# Patient Record
Sex: Male | Born: 1960 | Race: White | Hispanic: Refuse to answer | Marital: Married | Smoking: Never smoker
Health system: Southern US, Community
[De-identification: ages and names within clinical notes are randomized; demographics above are authoritative.]

## PROBLEM LIST (undated history)

## (undated) DIAGNOSIS — Z789 Other specified health status: Secondary | ICD-10-CM

## (undated) DIAGNOSIS — M1990 Unspecified osteoarthritis, unspecified site: Secondary | ICD-10-CM

## (undated) HISTORY — PX: WISDOM TOOTH EXTRACTION: SHX21

## (undated) HISTORY — PX: COLONOSCOPY: SHX174

## (undated) HISTORY — PX: APPENDECTOMY: SHX54

## (undated) HISTORY — PX: TONSILLECTOMY: SUR1361

## (undated) HISTORY — PX: ABDOMINAL SURGERY: SHX537

## (undated) HISTORY — PX: KNEE ARTHROSCOPY: SHX127

---

## 2002-03-15 ENCOUNTER — Ambulatory Visit (HOSPITAL_BASED_OUTPATIENT_CLINIC_OR_DEPARTMENT_OTHER): Admission: RE | Admit: 2002-03-15 | Discharge: 2002-03-16 | Payer: Self-pay | Admitting: Orthopedic Surgery

## 2003-05-15 ENCOUNTER — Ambulatory Visit (HOSPITAL_COMMUNITY): Admission: RE | Admit: 2003-05-15 | Discharge: 2003-05-15 | Payer: Self-pay | Admitting: Family Medicine

## 2003-12-24 ENCOUNTER — Ambulatory Visit (HOSPITAL_COMMUNITY): Admission: RE | Admit: 2003-12-24 | Discharge: 2003-12-24 | Payer: Self-pay | Admitting: Family Medicine

## 2008-06-21 HISTORY — PX: KNEE ARTHROSCOPY W/ ACL RECONSTRUCTION: SHX1858

## 2012-01-25 ENCOUNTER — Encounter (HOSPITAL_BASED_OUTPATIENT_CLINIC_OR_DEPARTMENT_OTHER): Payer: Self-pay | Admitting: *Deleted

## 2012-01-25 NOTE — Progress Notes (Signed)
Has had several knee scopes-no labs needed

## 2012-01-26 NOTE — H&P (Signed)
[facility] [address]   [address] [place], [place] [address] [phone] A Division of [facility]  Loreta Ave, M.D.     Robert A. Thurston Hole, M.D.     Lunette Stands, M.D. Eulas Post, M.D.    Buford Dresser, M.D. Estell Harpin, M.D. Genene Churn. Barry Dienes, PA-C            Kirstin A. Shepperson, PA-C [place], OPA-C   RE: [place], [place]   [phone]      DOB: [birth year] PROGRESS NOTE: 12-28-11 History of present illness: Mr. Fulfer is a 51 year old who presents with a left knee injury. He was playing basketball a week ago and landed awkwardly on the left knee felt a rotation and pop after shooting a jump shot. He developed a knee effusion. Since then his knee has felt unstable and "not right". He has used ice and elevation but no medications. He says this does not feel similar to when he tore his ACL in the past.  He initially had medial joint line pain however he does not have significant pain in the knee.  Past medical history reviewed and noted in the chart otherwise normal pertinent positives are left knee ACL repair in 2003 by Dr. Eulah Pont and debridement arthroscopy in 2008 by Dr. Eulah Pont.   EXAMINATION: Vital signs are noted in the chart and are normal. Well developed well nourished in no acute distress. Left knee has no significant effusion, no tenderness over the joint lines normal range of motion. Anterior drawer and Lachman are negative, normal varus and valgus stress. Mildly positive McMurray's medially.   IMPRESSION: PLAN: 51 year old male with probable meniscus injury. It is possible he has an ACL tear as history is concerning for that however his exam does not support this diagnosis at this time however he may be guarding. We'll plan to obtain a left knee MRI with discussion to proceed over the phone. If MRI shows significant meniscus injury will call patient and arrange for arthroscopic repair. If MRI is normal will  discuss over the phone with patient as well.   Loreta Ave, M.D.  Electronically verified by Loreta Ave, M.D. DFM(EC):kh D 12-27-01 T 12-28-01  [facility] [address]   [address] [place], [place] [address] [phone] A Division of [facility]  Loreta Ave, M.D.     Robert A. Thurston Hole, M.D.     Lunette Stands, M.D. Eulas Post, M.D.    Buford Dresser, M.D. Estell Harpin, M.D. Genene Churn. Barry Dienes, PA-C            Kirstin A. Shepperson, PA-C [place], OPA-C   RE: [place], [place]                                [phone]      DOB: [birth year] PHONE NOTE:  01-03-12 I spoke with Kathlene November on the phone today.  MRI shows a large tear medial meniscus accounting for the vast majority of his symptoms.  Symptoms are tolerable.  He wants to proceed with definitive treatment which is going to be in all likelihood arthroscopic partial medial meniscectomy.  I did state that there are some underlying degenerative changes and the impact that it is going to have on recovery.  He and I are both reassured however that his ligaments are intact and this  is not going to require a major reconstruction.    Loreta Ave, M.D.   Electronically verified by Loreta Ave, M.D. DFM:jjh D 01-03-12 T 01-03-12

## 2012-01-27 ENCOUNTER — Encounter (HOSPITAL_BASED_OUTPATIENT_CLINIC_OR_DEPARTMENT_OTHER): Payer: Self-pay

## 2012-01-27 ENCOUNTER — Encounter (HOSPITAL_BASED_OUTPATIENT_CLINIC_OR_DEPARTMENT_OTHER)
Admission: RE | Disposition: A | Discharge status: Home / Self Care | Payer: Self-pay | Source: Ambulatory Visit | Attending: Orthopedic Surgery

## 2012-01-27 ENCOUNTER — Encounter (HOSPITAL_BASED_OUTPATIENT_CLINIC_OR_DEPARTMENT_OTHER): Payer: Self-pay | Admitting: Anesthesiology

## 2012-01-27 ENCOUNTER — Ambulatory Visit (HOSPITAL_BASED_OUTPATIENT_CLINIC_OR_DEPARTMENT_OTHER)
Admission: RE | Admit: 2012-01-27 | Discharge: 2012-01-27 | Disposition: A | Discharge status: Home / Self Care | Payer: BC Managed Care – PPO | Source: Ambulatory Visit | Attending: Orthopedic Surgery | Admitting: Orthopedic Surgery

## 2012-01-27 ENCOUNTER — Ambulatory Visit (HOSPITAL_BASED_OUTPATIENT_CLINIC_OR_DEPARTMENT_OTHER): Payer: BC Managed Care – PPO | Admitting: Anesthesiology

## 2012-01-27 DIAGNOSIS — M2240 Chondromalacia patellae, unspecified knee: Secondary | ICD-10-CM | POA: Insufficient documentation

## 2012-01-27 DIAGNOSIS — Z4789 Encounter for other orthopedic aftercare: Secondary | ICD-10-CM

## 2012-01-27 DIAGNOSIS — Y998 Other external cause status: Secondary | ICD-10-CM | POA: Insufficient documentation

## 2012-01-27 DIAGNOSIS — X500XXA Overexertion from strenuous movement or load, initial encounter: Secondary | ICD-10-CM | POA: Insufficient documentation

## 2012-01-27 DIAGNOSIS — Y9367 Activity, basketball: Secondary | ICD-10-CM | POA: Insufficient documentation

## 2012-01-27 DIAGNOSIS — IMO0002 Reserved for concepts with insufficient information to code with codable children: Secondary | ICD-10-CM | POA: Insufficient documentation

## 2012-01-27 HISTORY — DX: Other specified health status: Z78.9

## 2012-01-27 SURGERY — Surgical Case
Anesthesia: *Unknown

## 2012-01-27 SURGERY — ARTHROSCOPY, KNEE, WITH MEDIAL MENISCECTOMY
Anesthesia: General | Site: Knee | Laterality: Left | Wound class: Clean

## 2012-01-27 MED ORDER — FENTANYL CITRATE 0.05 MG/ML IJ SOLN
INTRAMUSCULAR | Status: DC | PRN
  Administered 2012-01-27: 100 ug via INTRAVENOUS
  Administered 2012-01-27: 50 ug via INTRAVENOUS

## 2012-01-27 MED ORDER — BUPIVACAINE HCL (PF) 0.5 % IJ SOLN
INTRAMUSCULAR | Status: DC | PRN
  Administered 2012-01-27: 20 mL

## 2012-01-27 MED ORDER — LACTATED RINGERS IV SOLN
INTRAVENOUS | Status: DC
  Administered 2012-01-27: 11:00:00 via INTRAVENOUS

## 2012-01-27 MED ORDER — KETOROLAC TROMETHAMINE 30 MG/ML IJ SOLN
INTRAMUSCULAR | Status: DC | PRN
  Administered 2012-01-27: 30 mg via INTRAVENOUS

## 2012-01-27 MED ORDER — OXYCODONE-ACETAMINOPHEN 5-325 MG PO TABS: 1.0000 | ORAL_TABLET | ORAL | Status: AC | PRN

## 2012-01-27 MED ORDER — SODIUM CHLORIDE 0.9 % IR SOLN
Status: DC | PRN
  Administered 2012-01-27: 3000 mL

## 2012-01-27 MED ORDER — PROPOFOL 10 MG/ML IV EMUL
INTRAVENOUS | Status: DC | PRN
  Administered 2012-01-27: 200 mg via INTRAVENOUS

## 2012-01-27 MED ORDER — ONDANSETRON HCL 4 MG/2ML IJ SOLN
INTRAMUSCULAR | Status: DC | PRN
  Administered 2012-01-27: 4 mg via INTRAVENOUS

## 2012-01-27 MED ORDER — DEXAMETHASONE SODIUM PHOSPHATE 4 MG/ML IJ SOLN
INTRAMUSCULAR | Status: DC | PRN
  Administered 2012-01-27: 10 mg via INTRAVENOUS

## 2012-01-27 MED ORDER — CEFAZOLIN SODIUM-DEXTROSE 2-3 GM-% IV SOLR
2.0000 g | INTRAVENOUS | Status: AC
  Administered 2012-01-27: 2 g via INTRAVENOUS

## 2012-01-27 MED ORDER — METHYLPREDNISOLONE ACETATE 80 MG/ML IJ SUSP
INTRAMUSCULAR | Status: DC | PRN
  Administered 2012-01-27: 80 mg via INTRA_ARTICULAR

## 2012-01-27 MED ORDER — MIDAZOLAM HCL 5 MG/5ML IJ SOLN
INTRAMUSCULAR | Status: DC | PRN
  Administered 2012-01-27: 2 mg via INTRAVENOUS

## 2012-01-27 MED ORDER — LIDOCAINE HCL (CARDIAC) 20 MG/ML IV SOLN
INTRAVENOUS | Status: DC | PRN
  Administered 2012-01-27: 60 mg via INTRAVENOUS

## 2012-01-27 MED ORDER — HYDROMORPHONE HCL PF 1 MG/ML IJ SOLN
0.2500 mg | INTRAMUSCULAR | Status: DC | PRN
  Administered 2012-01-27 (×2): 0.5 mg via INTRAVENOUS

## 2012-01-27 SURGICAL SUPPLY — 37 items
BANDAGE ELASTIC 6 VELCRO ST LF (GAUZE/BANDAGES/DRESSINGS) ×4 IMPLANT
BLADE CUDA 5.5 (BLADE) IMPLANT
BLADE CUDA GRT WHITE 3.5 (BLADE) IMPLANT
BLADE CUTTER GATOR 3.5 (BLADE) ×2 IMPLANT
BLADE CUTTER MENIS 5.5 (BLADE) IMPLANT
BLADE GREAT WHITE 4.2 (BLADE) ×2 IMPLANT
BUR OVAL 4.0 (BURR) IMPLANT
CANISTER OMNI JUG 16 LITER (MISCELLANEOUS) ×2 IMPLANT
CANISTER SUCTION 2500CC (MISCELLANEOUS) IMPLANT
CLOTH BEACON ORANGE TIMEOUT ST (SAFETY) ×2 IMPLANT
CUTTER MENISCUS  4.2MM (BLADE)
CUTTER MENISCUS 4.2MM (BLADE) IMPLANT
DRAPE ARTHROSCOPY W/POUCH 90 (DRAPES) ×2 IMPLANT
DURAPREP 26ML APPLICATOR (WOUND CARE) ×2 IMPLANT
ELECT MENISCUS 165MM 90D (ELECTRODE) IMPLANT
ELECT REM PT RETURN 9FT ADLT (ELECTROSURGICAL) ×2
ELECTRODE REM PT RTRN 9FT ADLT (ELECTROSURGICAL) ×1 IMPLANT
GAUZE XEROFORM 1X8 LF (GAUZE/BANDAGES/DRESSINGS) ×2 IMPLANT
GLOVE BIO SURGEON STRL SZ 6.5 (GLOVE) ×2 IMPLANT
GLOVE BIOGEL PI IND STRL 8 (GLOVE) ×1 IMPLANT
GLOVE BIOGEL PI INDICATOR 8 (GLOVE) ×1
GLOVE INDICATOR 7.0 STRL GRN (GLOVE) ×2 IMPLANT
GLOVE ORTHO TXT STRL SZ7.5 (GLOVE) ×4 IMPLANT
GOWN PREVENTION PLUS XLARGE (GOWN DISPOSABLE) ×2 IMPLANT
GOWN STRL REIN 2XL XLG LVL4 (GOWN DISPOSABLE) ×2 IMPLANT
HOLDER KNEE FOAM BLUE (MISCELLANEOUS) ×2 IMPLANT
KNEE WRAP E Z 3 GEL PACK (MISCELLANEOUS) ×2 IMPLANT
PACK ARTHROSCOPY DSU (CUSTOM PROCEDURE TRAY) ×2 IMPLANT
PACK BASIN DAY SURGERY FS (CUSTOM PROCEDURE TRAY) ×2 IMPLANT
PENCIL BUTTON HOLSTER BLD 10FT (ELECTRODE) IMPLANT
SET ARTHROSCOPY TUBING (MISCELLANEOUS) ×2
SET ARTHROSCOPY TUBING LN (MISCELLANEOUS) ×1 IMPLANT
SPONGE GAUZE 4X4 12PLY (GAUZE/BANDAGES/DRESSINGS) ×4 IMPLANT
SUT ETHILON 3 0 PS 1 (SUTURE) ×2 IMPLANT
SUT VIC AB 3-0 FS2 27 (SUTURE) IMPLANT
TOWEL OR 17X24 6PK STRL BLUE (TOWEL DISPOSABLE) ×2 IMPLANT
WATER STERILE IRR 1000ML POUR (IV SOLUTION) ×2 IMPLANT

## 2012-01-27 NOTE — Anesthesia Preprocedure Evaluation (Signed)
Anesthesia Evaluation  Patient identified by MRN, date of birth, ID band Patient awake    Reviewed: Allergy & Precautions, H&P , NPO status , Patient's Chart, lab work & pertinent test results  Airway Mallampati: II TM Distance: >3 FB Neck ROM: Full    Dental No notable dental hx. (+) Teeth Intact and Dental Advisory Given   Pulmonary neg pulmonary ROS,  breath sounds clear to auscultation  Pulmonary exam normal       Cardiovascular negative cardio ROS  Rhythm:Regular Rate:Normal     Neuro/Psych negative neurological ROS  negative psych ROS   GI/Hepatic negative GI ROS, Neg liver ROS,   Endo/Other  negative endocrine ROS  Renal/GU negative Renal ROS  negative genitourinary   Musculoskeletal   Abdominal   Peds  Hematology negative hematology ROS (+)   Anesthesia Other Findings   Reproductive/Obstetrics negative OB ROS                           Anesthesia Physical Anesthesia Plan  ASA: I  Anesthesia Plan: General   Post-op Pain Management:    Induction: Intravenous  Airway Management Planned: LMA  Additional Equipment:   Intra-op Plan:   Post-operative Plan: Extubation in OR  Informed Consent: I have reviewed the patients History and Physical, chart, labs and discussed the procedure including the risks, benefits and alternatives for the proposed anesthesia with the patient or authorized representative who has indicated his/her understanding and acceptance.   Dental advisory given  Plan Discussed with: CRNA  Anesthesia Plan Comments:         Anesthesia Quick Evaluation  

## 2012-01-27 NOTE — Anesthesia Procedure Notes (Signed)
Procedure Name: LMA Insertion Performed by: Johnice Riebe W Pre-anesthesia Checklist: Patient identified, Timeout performed, Emergency Drugs available, Suction available and Patient being monitored Patient Re-evaluated:Patient Re-evaluated prior to inductionOxygen Delivery Method: Circle system utilized Preoxygenation: Pre-oxygenation with 100% oxygen Intubation Type: IV induction Ventilation: Mask ventilation without difficulty LMA: LMA inserted LMA Size: 4.0 Number of attempts: 1 Tube secured with: Tape Dental Injury: Teeth and Oropharynx as per pre-operative assessment      

## 2012-01-27 NOTE — Interval H&P Note (Signed)
History and Physical Interval Note:  01/27/2012 7:35 AM  Glenn Daniels  has presented today for surgery, with the diagnosis of Left knee tear, meniscus medial knee  The various methods of treatment have been discussed with the patient and family. After consideration of risks, benefits and other options for treatment, the patient has consented to  Procedure(s) (LRB): KNEE ARTHROSCOPY WITH MEDIAL MENISECTOMY (Left) as a surgical intervention .  The patient's history has been reviewed, patient examined, no change in status, stable for surgery.  I have reviewed the patient's chart and labs.  Questions were answered to the patient's satisfaction.     MURPHY,DANIEL F

## 2012-01-27 NOTE — Brief Op Note (Signed)
01/27/2012  12:56 PM  PATIENT:  Glenn Daniels  51 y.o. male  PRE-OPERATIVE DIAGNOSIS:  Left knee tear, meniscus medial knee  POST-OPERATIVE DIAGNOSIS:  Left knee tear, meniscus medial knee  PROCEDURE:  Procedure(s) (LRB): KNEE ARTHROSCOPY WITH MEDIAL MENISECTOMY (Left)  SURGEON:  Surgeon(s) and Role:    * Loreta Ave, MD - Primary  PHYSICIAN ASSISTANT: Zonia Kief M     ANESTHESIA:   general  EBL:  Total I/O In: 1100 [I.V.:1100] Out: -   SPECIMEN:  No Specimen  DISPOSITION OF SPECIMEN:  N/A  COUNTS:  YES  TOURNIQUET:  * No tourniquets in log *   PATIENT DISPOSITION:  PACU - hemodynamically stable.

## 2012-01-27 NOTE — Discharge Instructions (Signed)
  Post Anesthesia Home Care Instructions  Activity: Get plenty of rest for the remainder of the day. A responsible adult should stay with you for 24 hours following the procedure.  For the next 24 hours, DO NOT: -Drive a car -Operate machinery -Drink alcoholic beverages -Take any medication unless instructed by your physician -Make any legal decisions or sign important papers.  Meals: Start with liquid foods such as gelatin or soup. Progress to regular foods as tolerated. Avoid greasy, spicy, heavy foods. If nausea and/or vomiting occur, drink only clear liquids until the nausea and/or vomiting subsides. Call your physician if vomiting continues.  Special Instructions/Symptoms: Your throat may feel dry or sore from the anesthesia or the breathing tube placed in your throat during surgery. If this causes discomfort, gargle with warm salt water. The discomfort should disappear within 24 hours.  Discharge Instructions After Orthopedic Procedures:  *You may feel tired and weak following your procedure. It is recommended that you limit physical activity for the next 24 hours and rest at home for the remainder of today and tomorrow. *No strenuous activity should be started without your doctor's permission.  Elevate the extremity that you had surgery on to a level above your heart. This should continue for 48 hours or as instructed by your doctor.  If you had hand, arm or shoulder surgery you should move your fingers frequently unless otherwise instructed by your doctor.  If you had foot, knee or leg surgery you should wiggle your toes frequently unless otherwise instructed by your doctor.  Follow your doctor's exact instructions for activity at home. Use your home equipment as instructed. (Crutches, hard shoes, slings etc.)  Limit your activity as instructed by your doctor.  Report to your doctor should any of the following occur: 1. Extreme swelling of your fingers or toes. 2. Inability  to wiggle your fingers or toes. 3. Coldness, pale or bluish color in your fingers or toes. 4. Loss of sensation, numbness or tingling of your fingers or toes. 5. Unusual smell or odor from under your dressing or cast. 6. Excessive bleeding or drainage from the surgical site. 7. Pain not relieved by medication your doctor has prescribed for you. 8. Cast or dressing too tight (do not get your dressing or cast wet or put anything under          your dressing or cast.)  *Do not change your dressing unless instructed by your doctor or discharge nurse. Then follow exact instructions.  *Follow labeled instructions for any medications that your doctor may have prescribed for you. *Should any questions or complications develop following your procedure, PLEASE CONTACT YOUR DOCTOR.      

## 2012-01-27 NOTE — Transfer of Care (Signed)
Immediate Anesthesia Transfer of Care Note  Patient: Glenn Daniels  Procedure(s) Performed: Procedure(s) (LRB): KNEE ARTHROSCOPY WITH MEDIAL MENISECTOMY (Left)  Patient Location: PACU  Anesthesia Type: General  Level of Consciousness: awake and alert   Airway & Oxygen Therapy: Patient Spontanous Breathing and Patient connected to face mask oxygen  Post-op Assessment: Report given to PACU RN and Post -op Vital signs reviewed and stable  Post vital signs: Reviewed and stable  Complications: No apparent anesthesia complications

## 2012-01-27 NOTE — Anesthesia Postprocedure Evaluation (Signed)
  Anesthesia Post-op Note  Patient: Glenn Daniels  Procedure(s) Performed: Procedure(s) (LRB): KNEE ARTHROSCOPY WITH MEDIAL MENISECTOMY (Left)  Patient Location: PACU  Anesthesia Type: General  Level of Consciousness: awake and alert   Airway and Oxygen Therapy: Patient Spontanous Breathing and Patient connected to face mask oxygen  Post-op Pain: mild  Post-op Assessment: Post-op Vital signs reviewed, Patient's Cardiovascular Status Stable, Respiratory Function Stable, Patent Airway and No signs of Nausea or vomiting  Post-op Vital Signs: Reviewed and stable  Complications: No apparent anesthesia complications

## 2012-01-28 LAB — POCT HEMOGLOBIN-HEMACUE: Hemoglobin: 15.4 g/dL (ref 13.0–17.0)

## 2012-01-28 NOTE — Op Note (Signed)
NAME:  AMINE, ADELSON NO.:  [id number]  MEDICAL RECORD NO.:  [id number]  LOCATION:                                 FACILITY:  PHYSICIAN:  Loreta Ave, M.D.      DATE OF BIRTH:  DATE OF PROCEDURE:  01/27/2012 DATE OF DISCHARGE:                              OPERATIVE REPORT   PREOPERATIVE DIAGNOSIS:  Left knee medial meniscus tear.  POSTOPERATIVE DIAGNOSES:  Left knee medial meniscus tear with a large irreparable radial tear junction in the middle posterior third, medial meniscus.  Also grade 3 chondromalacia throughout the trochlea, grade 2 and 3 on the patella and medial femoral condyle with some chondral debris.  PROCEDURE:  Left knee exam under anesthesia and arthroscopy.  Partial medial meniscectomy.  Chondroplasty, patellofemoral joint, medial femoral condyle.  Removal of chondral loose bodies.  SURGEON:  Loreta Ave, MD  ASSISTANT:  Genene Churn. Barry Dienes, [place]  ANESTHESIA:  General.  BLOOD LOSS:  Minimal.  SPECIMENS:  None.  CULTURES:  None.  COMPLICATION:  None.  DRESSINGS:  Soft compressive.  TOURNIQUET:  Not employed.  PROCEDURE:  The patient was brought to the operating room and placed on the operating table in supine position.  After adequate anesthesia had been obtained, leg holder applied.  Leg was prepped and draped in usual sterile fashion.  Two portals, one each medial and lateral parapatellar. Arthroscope was introduced.  Knee was distended and inspected.  Good patellar tracking, but some grade 2, mild grade 3 changes peak of the patella.  Lateral facet debrided.  Not tethering.  Almost, the entire trochlea had grade 3 changes getting a little worse when inferiorly. Chondral flaps and loose bodies were removed.  Cruciate ligaments were intact.  Lateral meniscus and lateral compartment were normal.  Medial meniscus large.  Radial tear irreparable, junction of the middle posterior third with a lot of intrameniscal tearing in the  remaining posterior third.  Posterior third removed, tapered in smoothly. Salvaging most of the anterior two thirds.  Some grade 2 changes on the condyle debrided.  Entire knee examined. No other findings were appreciated.  Instruments and fluids were removed.  Portals were closed with nylon.  Knee injected with Depo- Medrol and Marcaine.  Sterile compressive dressing applied.  Anesthesia reversed.  Brought to the recovery room.  Tolerated the surgery well. No complications.     Loreta Ave, M.D.     DFM/MEDQ  D:  01/27/2012  T:  01/28/2012  Job:  [id number]

## 2013-11-02 ENCOUNTER — Ambulatory Visit: Payer: Self-pay | Admitting: Unknown Physician Specialty

## 2013-11-06 LAB — PATHOLOGY REPORT

## 2014-01-01 ENCOUNTER — Inpatient Hospital Stay: Payer: Self-pay | Admitting: Surgery

## 2014-01-03 LAB — PLATELET COUNT: Platelet: 191 10*3/uL (ref 150–440)

## 2014-01-04 LAB — PATHOLOGY REPORT

## 2014-03-01 ENCOUNTER — Other Ambulatory Visit: Payer: Self-pay | Admitting: Orthopedic Surgery

## 2014-05-02 ENCOUNTER — Encounter (HOSPITAL_COMMUNITY): Payer: Self-pay

## 2014-05-02 ENCOUNTER — Encounter (HOSPITAL_COMMUNITY)
Admission: RE | Admit: 2014-05-02 | Discharge: 2014-05-02 | Disposition: A | Discharge status: Home / Self Care | Payer: BC Managed Care – PPO | Source: Ambulatory Visit | Attending: Orthopedic Surgery | Admitting: Orthopedic Surgery

## 2014-05-02 ENCOUNTER — Encounter (HOSPITAL_COMMUNITY): Admission: RE | Admit: 2014-05-02 | Payer: BC Managed Care – PPO | Source: Ambulatory Visit

## 2014-05-02 DIAGNOSIS — Z01812 Encounter for preprocedural laboratory examination: Secondary | ICD-10-CM | POA: Insufficient documentation

## 2014-05-02 HISTORY — DX: Unspecified osteoarthritis, unspecified site: M19.90

## 2014-05-02 LAB — CBC WITH DIFFERENTIAL/PLATELET
Basophils Absolute: 0 10*3/uL (ref 0.0–0.1)
Basophils Relative: 1 % (ref 0–1)
EOS PCT: 3 % (ref 0–5)
Eosinophils Absolute: 0.2 10*3/uL (ref 0.0–0.7)
HCT: 45.5 % (ref 39.0–52.0)
Hemoglobin: 16.1 g/dL (ref 13.0–17.0)
LYMPHS PCT: 42 % (ref 12–46)
Lymphs Abs: 2.2 10*3/uL (ref 0.7–4.0)
MCH: 32.5 pg (ref 26.0–34.0)
MCHC: 35.4 g/dL (ref 30.0–36.0)
MCV: 91.7 fL (ref 78.0–100.0)
Monocytes Absolute: 0.3 10*3/uL (ref 0.1–1.0)
Monocytes Relative: 6 % (ref 3–12)
Neutro Abs: 2.5 10*3/uL (ref 1.7–7.7)
Neutrophils Relative %: 48 % (ref 43–77)
PLATELETS: 203 10*3/uL (ref 150–400)
RBC: 4.96 MIL/uL (ref 4.22–5.81)
RDW: 12.5 % (ref 11.5–15.5)
WBC: 5.2 10*3/uL (ref 4.0–10.5)

## 2014-05-02 LAB — COMPREHENSIVE METABOLIC PANEL
ALT: 27 U/L (ref 0–53)
AST: 22 U/L (ref 0–37)
Albumin: 4 g/dL (ref 3.5–5.2)
Alkaline Phosphatase: 76 U/L (ref 39–117)
Anion gap: 11 (ref 5–15)
BUN: 16 mg/dL (ref 6–23)
CO2: 27 mEq/L (ref 19–32)
Calcium: 9.6 mg/dL (ref 8.4–10.5)
Chloride: 100 mEq/L (ref 96–112)
Creatinine, Ser: 1.11 mg/dL (ref 0.50–1.35)
GFR calc Af Amer: 87 mL/min — ABNORMAL LOW (ref >90)
GFR, EST NON AFRICAN AMERICAN: 75 mL/min — AB (ref >90)
Glucose, Bld: 91 mg/dL (ref 70–99)
Potassium: 4.6 mEq/L (ref 3.7–5.3)
Sodium: 138 mEq/L (ref 137–147)
Total Bilirubin: 0.5 mg/dL (ref 0.3–1.2)
Total Protein: 7.6 g/dL (ref 6.0–8.3)

## 2014-05-02 LAB — URINALYSIS, ROUTINE W REFLEX MICROSCOPIC
BILIRUBIN URINE: NEGATIVE
Glucose, UA: NEGATIVE mg/dL
Hgb urine dipstick: NEGATIVE
Ketones, ur: NEGATIVE mg/dL
LEUKOCYTES UA: NEGATIVE
Nitrite: NEGATIVE
Protein, ur: NEGATIVE mg/dL
Specific Gravity, Urine: <1.005 — ABNORMAL LOW (ref 1.005–1.030)
Urobilinogen, UA: 0.2 mg/dL (ref 0.0–1.0)
pH: 6.5 (ref 5.0–8.0)

## 2014-05-02 LAB — PROTIME-INR
INR: 0.94 (ref 0.00–1.49)
Prothrombin Time: 12.6 seconds (ref 11.6–15.2)

## 2014-05-02 LAB — SURGICAL PCR SCREEN
MRSA, PCR: NEGATIVE
STAPHYLOCOCCUS AUREUS: NEGATIVE

## 2014-05-02 LAB — APTT: APTT: 26 s (ref 24–37)

## 2014-05-02 NOTE — Pre-Procedure Instructions (Addendum)
Glenn ClarityMichael H Daniels  05/02/2014   Your procedure is scheduled on:  Monday, November 23.  [facility].  Call this number if you have problems the morning of surgery: [phone]   Remember:   Do not eat food or drink liquids after midnight Sunday, November 22.  Take these medicines the morning of surgery with A SIP OF WATER: - None              Stop taking Advil Monday, November 16   Do not wear jewelry, make-up or nail polish.  Do not wear lotions, powders, or perfumes.   Men may shave face and neck.  Do not bring valuables to the              [facility] is not responsible for any belongings or valuables.               Contacts, dentures or bridgework may not be worn into surgery.  Leave suitcase in the car. After surgery it may be brought to your room.  For patients admitted to the hospital, discharge time is determined by your treatment team.               Patients discharged the day of surgery will not be allowed to drive home.  Name and phone number of your driver: -   Special Instructions: Review  [place] - Preparing For Surgery.   Please read over the following fact sheets that you were given: Pain Booklet, Coughing and Deep Breathing and Surgical Site Infection Prevention and Incentive Spirometry.

## 2014-05-03 LAB — URINE CULTURE
Colony Count: NO GROWTH
Culture: NO GROWTH

## 2014-05-12 MED ORDER — CHLORHEXIDINE GLUCONATE 4 % EX LIQD
60.0000 mL | Freq: Once | CUTANEOUS | Status: DC
  Filled 2014-05-12: qty 60

## 2014-05-12 MED ORDER — TRANEXAMIC ACID 100 MG/ML IV SOLN
1000.0000 mg | INTRAVENOUS | Status: AC
  Administered 2014-05-13: 1000 mg via INTRAVENOUS
  Filled 2014-05-12: qty 10

## 2014-05-12 MED ORDER — BUPIVACAINE LIPOSOME 1.3 % IJ SUSP
20.0000 mL | Freq: Once | INTRAMUSCULAR | Status: DC
  Filled 2014-05-12: qty 20

## 2014-05-12 MED ORDER — SODIUM CHLORIDE 0.9 % IV SOLN: INTRAVENOUS | Status: DC

## 2014-05-12 MED ORDER — CEFAZOLIN SODIUM-DEXTROSE 2-3 GM-% IV SOLR
2.0000 g | INTRAVENOUS | Status: DC
  Filled 2014-05-12: qty 50

## 2014-05-13 ENCOUNTER — Inpatient Hospital Stay (HOSPITAL_COMMUNITY)
Admission: RE | Admit: 2014-05-13 | Discharge: 2014-05-14 | DRG: 470 | Disposition: A | Discharge status: Home / Self Care | Payer: BC Managed Care – PPO | Source: Ambulatory Visit | Attending: Orthopedic Surgery | Admitting: Orthopedic Surgery

## 2014-05-13 ENCOUNTER — Encounter (HOSPITAL_COMMUNITY)
Admission: RE | Disposition: A | Discharge status: Home / Self Care | Payer: Self-pay | Source: Ambulatory Visit | Attending: Orthopedic Surgery

## 2014-05-13 ENCOUNTER — Inpatient Hospital Stay (HOSPITAL_COMMUNITY): Payer: BC Managed Care – PPO | Admitting: Anesthesiology

## 2014-05-13 ENCOUNTER — Encounter (HOSPITAL_COMMUNITY): Payer: Self-pay | Admitting: *Deleted

## 2014-05-13 DIAGNOSIS — M1711 Unilateral primary osteoarthritis, right knee: Secondary | ICD-10-CM | POA: Diagnosis present

## 2014-05-13 DIAGNOSIS — M25561 Pain in right knee: Secondary | ICD-10-CM | POA: Diagnosis present

## 2014-05-13 DIAGNOSIS — Z96659 Presence of unspecified artificial knee joint: Secondary | ICD-10-CM

## 2014-05-13 DIAGNOSIS — D62 Acute posthemorrhagic anemia: Secondary | ICD-10-CM | POA: Diagnosis not present

## 2014-05-13 HISTORY — PX: TOTAL KNEE ARTHROPLASTY: SHX125

## 2014-05-13 LAB — CBC
HCT: 37.4 % — ABNORMAL LOW (ref 39.0–52.0)
Hemoglobin: 12.9 g/dL — ABNORMAL LOW (ref 13.0–17.0)
MCH: 31.1 pg (ref 26.0–34.0)
MCHC: 34.5 g/dL (ref 30.0–36.0)
MCV: 90.1 fL (ref 78.0–100.0)
Platelets: 197 10*3/uL (ref 150–400)
RBC: 4.15 MIL/uL — ABNORMAL LOW (ref 4.22–5.81)
RDW: 12.7 % (ref 11.5–15.5)
WBC: 9.7 10*3/uL (ref 4.0–10.5)

## 2014-05-13 LAB — CREATININE, SERUM
CREATININE: 0.83 mg/dL (ref 0.50–1.35)
GFR calc Af Amer: >90 mL/min (ref >90)
GFR calc non Af Amer: >90 mL/min (ref >90)

## 2014-05-13 SURGERY — ARTHROPLASTY, KNEE, TOTAL
Anesthesia: General | Site: Knee | Laterality: Right

## 2014-05-13 MED ORDER — ONDANSETRON HCL 4 MG PO TABS: 4.0000 mg | ORAL_TABLET | Freq: Four times a day (QID) | ORAL | Status: DC | PRN

## 2014-05-13 MED ORDER — DIPHENHYDRAMINE HCL 12.5 MG/5ML PO ELIX
12.5000 mg | ORAL_SOLUTION | ORAL | Status: DC | PRN
  Administered 2014-05-14: 25 mg via ORAL
  Filled 2014-05-13: qty 10

## 2014-05-13 MED ORDER — METOCLOPRAMIDE HCL 10 MG PO TABS: 5.0000 mg | ORAL_TABLET | Freq: Three times a day (TID) | ORAL | Status: DC | PRN

## 2014-05-13 MED ORDER — HYDROMORPHONE HCL 1 MG/ML IJ SOLN
0.2500 mg | INTRAMUSCULAR | Status: DC | PRN
  Administered 2014-05-13: 0.5 mg via INTRAVENOUS
  Administered 2014-05-13: 0.25 mg via INTRAVENOUS
  Administered 2014-05-13 (×2): 0.5 mg via INTRAVENOUS

## 2014-05-13 MED ORDER — 0.9 % SODIUM CHLORIDE (POUR BTL) OPTIME
TOPICAL | Status: DC | PRN
  Administered 2014-05-13: 1000 mL

## 2014-05-13 MED ORDER — DOCUSATE SODIUM 100 MG PO CAPS
100.0000 mg | ORAL_CAPSULE | Freq: Two times a day (BID) | ORAL | Status: DC
  Administered 2014-05-13 – 2014-05-14 (×2): 100 mg via ORAL
  Filled 2014-05-13 (×3): qty 1

## 2014-05-13 MED ORDER — ONDANSETRON HCL 4 MG/2ML IJ SOLN: 4.0000 mg | Freq: Four times a day (QID) | INTRAMUSCULAR | Status: DC | PRN

## 2014-05-13 MED ORDER — BISACODYL 5 MG PO TBEC: 5.0000 mg | DELAYED_RELEASE_TABLET | Freq: Every day | ORAL | Status: DC | PRN

## 2014-05-13 MED ORDER — ACETAMINOPHEN 325 MG PO TABS: 650.0000 mg | ORAL_TABLET | Freq: Four times a day (QID) | ORAL | Status: DC | PRN

## 2014-05-13 MED ORDER — HYDROMORPHONE HCL 1 MG/ML IJ SOLN
1.0000 mg | INTRAMUSCULAR | Status: DC | PRN
  Administered 2014-05-13 – 2014-05-14 (×3): 1 mg via INTRAVENOUS
  Filled 2014-05-13 (×3): qty 1

## 2014-05-13 MED ORDER — ACETAMINOPHEN 650 MG RE SUPP: 650.0000 mg | Freq: Four times a day (QID) | RECTAL | Status: DC | PRN

## 2014-05-13 MED ORDER — MIDAZOLAM HCL 5 MG/5ML IJ SOLN
INTRAMUSCULAR | Status: DC | PRN
  Administered 2014-05-13: 2 mg via INTRAVENOUS

## 2014-05-13 MED ORDER — LIDOCAINE HCL (CARDIAC) 20 MG/ML IV SOLN
INTRAVENOUS | Status: AC
  Filled 2014-05-13: qty 5

## 2014-05-13 MED ORDER — MEPERIDINE HCL 25 MG/ML IJ SOLN
6.2500 mg | INTRAMUSCULAR | Status: DC | PRN
Start: 2014-05-13 — End: 2014-05-13
  Administered 2014-05-13: 12.5 mg via INTRAVENOUS

## 2014-05-13 MED ORDER — METOCLOPRAMIDE HCL 5 MG/ML IJ SOLN: 5.0000 mg | Freq: Three times a day (TID) | INTRAMUSCULAR | Status: DC | PRN

## 2014-05-13 MED ORDER — BUPIVACAINE-EPINEPHRINE (PF) 0.5% -1:200000 IJ SOLN
INTRAMUSCULAR | Status: AC
  Filled 2014-05-13: qty 30

## 2014-05-13 MED ORDER — DIPHENHYDRAMINE HCL 50 MG/ML IJ SOLN
6.2500 mg | Freq: Four times a day (QID) | INTRAMUSCULAR | Status: DC | PRN
  Administered 2014-05-13: 6.5 mg via INTRAVENOUS

## 2014-05-13 MED ORDER — BUPIVACAINE-EPINEPHRINE 0.5% -1:200000 IJ SOLN
INTRAMUSCULAR | Status: DC | PRN
  Administered 2014-05-13: 30 mL

## 2014-05-13 MED ORDER — ONDANSETRON HCL 4 MG/2ML IJ SOLN: 4.0000 mg | Freq: Once | INTRAMUSCULAR | Status: DC | PRN

## 2014-05-13 MED ORDER — HYDROMORPHONE HCL 1 MG/ML IJ SOLN
0.5000 mg | INTRAMUSCULAR | Status: DC | PRN
  Administered 2014-05-13: 0.5 mg via INTRAVENOUS

## 2014-05-13 MED ORDER — MIDAZOLAM HCL 2 MG/2ML IJ SOLN
1.0000 mg | Freq: Once | INTRAMUSCULAR | Status: AC
  Administered 2014-05-13: 0.5 mg via INTRAVENOUS

## 2014-05-13 MED ORDER — LIDOCAINE HCL (CARDIAC) 20 MG/ML IV SOLN
INTRAVENOUS | Status: DC | PRN
  Administered 2014-05-13: 100 mg via INTRAVENOUS

## 2014-05-13 MED ORDER — OXYCODONE HCL 5 MG/5ML PO SOLN: 5.0000 mg | Freq: Once | ORAL | Status: AC | PRN

## 2014-05-13 MED ORDER — MIDAZOLAM HCL 2 MG/2ML IJ SOLN
INTRAMUSCULAR | Status: AC
  Filled 2014-05-13: qty 2

## 2014-05-13 MED ORDER — FENTANYL CITRATE 0.05 MG/ML IJ SOLN
INTRAMUSCULAR | Status: DC | PRN
  Administered 2014-05-13: 100 ug via INTRAVENOUS
  Administered 2014-05-13 (×4): 50 ug via INTRAVENOUS
  Administered 2014-05-13 (×2): 100 ug via INTRAVENOUS

## 2014-05-13 MED ORDER — LACTATED RINGERS IV SOLN
INTRAVENOUS | Status: DC | PRN
  Administered 2014-05-13 (×2): via INTRAVENOUS

## 2014-05-13 MED ORDER — METHOCARBAMOL 500 MG PO TABS
ORAL_TABLET | ORAL | Status: AC
  Filled 2014-05-13: qty 1

## 2014-05-13 MED ORDER — HYDROMORPHONE HCL 1 MG/ML IJ SOLN
INTRAMUSCULAR | Status: AC
  Filled 2014-05-13: qty 1

## 2014-05-13 MED ORDER — METHOCARBAMOL 1000 MG/10ML IJ SOLN
500.0000 mg | Freq: Four times a day (QID) | INTRAMUSCULAR | Status: DC | PRN
  Filled 2014-05-13: qty 5

## 2014-05-13 MED ORDER — SENNOSIDES-DOCUSATE SODIUM 8.6-50 MG PO TABS
1.0000 | ORAL_TABLET | Freq: Every evening | ORAL | Status: DC | PRN
  Administered 2014-05-13: 1 via ORAL
  Filled 2014-05-13 (×2): qty 1

## 2014-05-13 MED ORDER — OXYCODONE HCL ER 10 MG PO T12A
10.0000 mg | EXTENDED_RELEASE_TABLET | Freq: Two times a day (BID) | ORAL | Status: DC
  Administered 2014-05-13 – 2014-05-14 (×3): 10 mg via ORAL
  Filled 2014-05-13 (×3): qty 1

## 2014-05-13 MED ORDER — PROPOFOL 10 MG/ML IV BOLUS
INTRAVENOUS | Status: AC
  Filled 2014-05-13: qty 20

## 2014-05-13 MED ORDER — OXYCODONE HCL 5 MG PO TABS
5.0000 mg | ORAL_TABLET | ORAL | Status: DC | PRN
  Administered 2014-05-13 – 2014-05-14 (×9): 10 mg via ORAL
  Filled 2014-05-13 (×9): qty 2

## 2014-05-13 MED ORDER — ONDANSETRON HCL 4 MG/2ML IJ SOLN
INTRAMUSCULAR | Status: DC | PRN
  Administered 2014-05-13: 4 mg via INTRAVENOUS

## 2014-05-13 MED ORDER — ALUM & MAG HYDROXIDE-SIMETH 200-200-20 MG/5ML PO SUSP: 30.0000 mL | ORAL | Status: DC | PRN

## 2014-05-13 MED ORDER — CELECOXIB 200 MG PO CAPS
200.0000 mg | ORAL_CAPSULE | Freq: Two times a day (BID) | ORAL | Status: DC
  Administered 2014-05-13 – 2014-05-14 (×2): 200 mg via ORAL
  Filled 2014-05-13 (×4): qty 1

## 2014-05-13 MED ORDER — SODIUM CHLORIDE 0.9 % IV SOLN
INTRAVENOUS | Status: DC
  Administered 2014-05-13: 75 mL/h via INTRAVENOUS

## 2014-05-13 MED ORDER — CEFAZOLIN SODIUM-DEXTROSE 2-3 GM-% IV SOLR
INTRAVENOUS | Status: DC | PRN
  Administered 2014-05-13: 2 g via INTRAVENOUS

## 2014-05-13 MED ORDER — CEFAZOLIN SODIUM-DEXTROSE 2-3 GM-% IV SOLR
2.0000 g | Freq: Four times a day (QID) | INTRAVENOUS | Status: AC
  Administered 2014-05-13 (×2): 2 g via INTRAVENOUS
  Filled 2014-05-13 (×3): qty 50

## 2014-05-13 MED ORDER — BUPIVACAINE-EPINEPHRINE (PF) 0.5% -1:200000 IJ SOLN
INTRAMUSCULAR | Status: DC | PRN
  Administered 2014-05-13: 30 mL via PERINEURAL

## 2014-05-13 MED ORDER — SODIUM CHLORIDE 0.9 % IR SOLN
Status: DC | PRN
  Administered 2014-05-13: 1000 mL

## 2014-05-13 MED ORDER — MEPERIDINE HCL 25 MG/ML IJ SOLN
INTRAMUSCULAR | Status: AC
  Filled 2014-05-13: qty 1

## 2014-05-13 MED ORDER — DIPHENHYDRAMINE HCL 50 MG/ML IJ SOLN
INTRAMUSCULAR | Status: AC
Start: 2014-05-13 — End: 2014-05-13
  Filled 2014-05-13: qty 1

## 2014-05-13 MED ORDER — BUPIVACAINE LIPOSOME 1.3 % IJ SUSP
INTRAMUSCULAR | Status: DC | PRN
  Administered 2014-05-13: 20 mL

## 2014-05-13 MED ORDER — OXYCODONE HCL 5 MG PO TABS
ORAL_TABLET | ORAL | Status: AC
  Filled 2014-05-13: qty 1

## 2014-05-13 MED ORDER — ONDANSETRON HCL 4 MG/2ML IJ SOLN
INTRAMUSCULAR | Status: AC
  Filled 2014-05-13: qty 2

## 2014-05-13 MED ORDER — FENTANYL CITRATE 0.05 MG/ML IJ SOLN
INTRAMUSCULAR | Status: AC
Start: 2014-05-13 — End: 2014-05-13
  Filled 2014-05-13: qty 5

## 2014-05-13 MED ORDER — FLEET ENEMA 7-19 GM/118ML RE ENEM: 1.0000 | ENEMA | Freq: Once | RECTAL | Status: AC | PRN

## 2014-05-13 MED ORDER — OXYCODONE HCL 5 MG PO TABS
5.0000 mg | ORAL_TABLET | Freq: Once | ORAL | Status: AC | PRN
  Administered 2014-05-13: 5 mg via ORAL

## 2014-05-13 MED ORDER — PHENOL 1.4 % MT LIQD: 1.0000 | OROMUCOSAL | Status: DC | PRN

## 2014-05-13 MED ORDER — ZOLPIDEM TARTRATE 5 MG PO TABS: 5.0000 mg | ORAL_TABLET | Freq: Every evening | ORAL | Status: DC | PRN

## 2014-05-13 MED ORDER — FENTANYL CITRATE 0.05 MG/ML IJ SOLN
INTRAMUSCULAR | Status: AC
  Filled 2014-05-13: qty 5

## 2014-05-13 MED ORDER — PROPOFOL 10 MG/ML IV BOLUS
INTRAVENOUS | Status: DC | PRN
  Administered 2014-05-13: 150 mg via INTRAVENOUS

## 2014-05-13 MED ORDER — MENTHOL 3 MG MT LOZG: 1.0000 | LOZENGE | OROMUCOSAL | Status: DC | PRN

## 2014-05-13 MED ORDER — METHOCARBAMOL 500 MG PO TABS
500.0000 mg | ORAL_TABLET | Freq: Four times a day (QID) | ORAL | Status: DC | PRN
  Administered 2014-05-13 – 2014-05-14 (×5): 500 mg via ORAL
  Filled 2014-05-13 (×4): qty 1

## 2014-05-13 MED ORDER — ENOXAPARIN SODIUM 30 MG/0.3ML ~~LOC~~ SOLN
30.0000 mg | Freq: Two times a day (BID) | SUBCUTANEOUS | Status: DC
  Administered 2014-05-14: 30 mg via SUBCUTANEOUS
  Filled 2014-05-13 (×3): qty 0.3

## 2014-05-13 SURGICAL SUPPLY — 62 items
BANDAGE ESMARK 6X9 LF (GAUZE/BANDAGES/DRESSINGS) ×1 IMPLANT
BLADE SAGITTAL 13X1.27X60 (BLADE) ×2 IMPLANT
BLADE SAW SGTL 83.5X18.5 (BLADE) ×2 IMPLANT
BLADE SURG 10 STRL SS (BLADE) ×2 IMPLANT
BNDG CMPR 9X6 STRL LF SNTH (GAUZE/BANDAGES/DRESSINGS) ×1
BNDG ESMARK 6X9 LF (GAUZE/BANDAGES/DRESSINGS) ×2
BOWL SMART MIX CTS (DISPOSABLE) ×2 IMPLANT
CAP POR NKTM CP VIT E LN CER ×2 IMPLANT
CEMENT BONE SIMPLEX SPEEDSET (Cement) ×4 IMPLANT
COVER SURGICAL LIGHT HANDLE (MISCELLANEOUS) ×2 IMPLANT
CUFF TOURNIQUET SINGLE 34IN LL (TOURNIQUET CUFF) ×2 IMPLANT
DRAPE EXTREMITY T 121X128X90 (DRAPE) ×2 IMPLANT
DRAPE IMP U-DRAPE 54X76 (DRAPES) ×2 IMPLANT
DRAPE INCISE IOBAN 66X45 STRL (DRAPES) ×4 IMPLANT
DRAPE PROXIMA HALF (DRAPES) IMPLANT
DRAPE U-SHAPE 47X51 STRL (DRAPES) ×2 IMPLANT
DRSG ADAPTIC 3X8 NADH LF (GAUZE/BANDAGES/DRESSINGS) ×2 IMPLANT
DRSG PAD ABDOMINAL 8X10 ST (GAUZE/BANDAGES/DRESSINGS) ×2 IMPLANT
DURAPREP 26ML APPLICATOR (WOUND CARE) ×2 IMPLANT
ELECT REM PT RETURN 9FT ADLT (ELECTROSURGICAL) ×2
ELECTRODE REM PT RTRN 9FT ADLT (ELECTROSURGICAL) ×1 IMPLANT
EVACUATOR 1/8 PVC DRAIN (DRAIN) ×2 IMPLANT
GAUZE SPONGE 4X4 12PLY STRL (GAUZE/BANDAGES/DRESSINGS) ×2 IMPLANT
GLOVE BIOGEL M 7.0 STRL (GLOVE) IMPLANT
GLOVE BIOGEL PI IND STRL 6.5 (GLOVE) ×1 IMPLANT
GLOVE BIOGEL PI IND STRL 7.5 (GLOVE) IMPLANT
GLOVE BIOGEL PI IND STRL 8 (GLOVE) ×1 IMPLANT
GLOVE BIOGEL PI IND STRL 8.5 (GLOVE) ×2 IMPLANT
GLOVE BIOGEL PI INDICATOR 6.5 (GLOVE) ×1
GLOVE BIOGEL PI INDICATOR 7.5 (GLOVE)
GLOVE BIOGEL PI INDICATOR 8 (GLOVE) ×1
GLOVE BIOGEL PI INDICATOR 8.5 (GLOVE) ×2
GLOVE SURG ORTHO 8.0 STRL STRW (GLOVE) ×8 IMPLANT
GLOVE SURG SS PI 6.5 STRL IVOR (GLOVE) ×2 IMPLANT
GOWN STRL REUS W/ TWL LRG LVL3 (GOWN DISPOSABLE) ×1 IMPLANT
GOWN STRL REUS W/ TWL XL LVL3 (GOWN DISPOSABLE) ×2 IMPLANT
GOWN STRL REUS W/TWL LRG LVL3 (GOWN DISPOSABLE) ×2
GOWN STRL REUS W/TWL XL LVL3 (GOWN DISPOSABLE) ×4
HANDPIECE INTERPULSE COAX TIP (DISPOSABLE) ×2
HOOD PEEL AWAY FACE SHEILD DIS (HOOD) ×6 IMPLANT
KIT BASIN OR (CUSTOM PROCEDURE TRAY) ×2 IMPLANT
KIT ROOM TURNOVER OR (KITS) ×2 IMPLANT
MANIFOLD NEPTUNE II (INSTRUMENTS) ×2 IMPLANT
NEEDLE 22X1 1/2 (OR ONLY) (NEEDLE) ×4 IMPLANT
NEEDLE SPNL 18GX3.5 QUINCKE PK (NEEDLE) ×2 IMPLANT
NS IRRIG 1000ML POUR BTL (IV SOLUTION) ×2 IMPLANT
PACK TOTAL JOINT (CUSTOM PROCEDURE TRAY) ×2 IMPLANT
PACK UNIVERSAL I (CUSTOM PROCEDURE TRAY) ×2 IMPLANT
PAD ARMBOARD 7.5X6 YLW CONV (MISCELLANEOUS) ×2 IMPLANT
PADDING CAST COTTON 6X4 STRL (CAST SUPPLIES) ×2 IMPLANT
SET HNDPC FAN SPRY TIP SCT (DISPOSABLE) ×1 IMPLANT
STAPLER VISISTAT 35W (STAPLE) ×2 IMPLANT
SUCTION FRAZIER TIP 10 FR DISP (SUCTIONS) ×2 IMPLANT
SUT BONE WAX W31G (SUTURE) ×2 IMPLANT
SUT VIC AB 0 CTB1 27 (SUTURE) ×4 IMPLANT
SUT VIC AB 1 CT1 27 (SUTURE) ×6
SUT VIC AB 1 CT1 27XBRD ANBCTR (SUTURE) ×3 IMPLANT
SUT VIC AB 2-0 CT1 27 (SUTURE) ×2
SUT VIC AB 2-0 CT1 TAPERPNT 27 (SUTURE) ×1 IMPLANT
SYR 20CC LL (SYRINGE) ×4 IMPLANT
TOWEL OR 17X24 6PK STRL BLUE (TOWEL DISPOSABLE) ×2 IMPLANT
TOWEL OR 17X26 10 PK STRL BLUE (TOWEL DISPOSABLE) ×2 IMPLANT

## 2014-05-13 NOTE — Anesthesia Preprocedure Evaluation (Signed)

## 2014-05-13 NOTE — Care Management Utilization Note (Signed)
UR review completed. Verlaine Embry, RN BSN Case Manager 

## 2014-05-13 NOTE — Anesthesia Postprocedure Evaluation (Signed)
Anesthesia Post Note  Patient: Glenn ClarityMichael H Daniels  Procedure(s) Performed: Procedure(s) (LRB): RIGHT TOTAL KNEE ARTHROPLASTY (Right)  Anesthesia type: general  Patient location: PACU  Post pain: Pain level controlled  Post assessment: Patient's Cardiovascular Status Stable  Last Vitals:  Filed Vitals:   05/13/14 1130  BP: 118/73  Pulse: 66  Temp: 36.5 C  Resp: 16    Post vital signs: Reviewed and stable  Level of consciousness: sedated  Complications: No apparent anesthesia complications

## 2014-05-13 NOTE — Progress Notes (Signed)
Orthopedic Tech Progress Note Patient Details:  Glenn Daniels [birth year] [id number] On cpm at 7:40 pm 0-70 rn to increase  Patient ID: Glenn Daniels, male   DOB: [birth year], 53 y.o.   MRN: [id number]   Jennye MoccasinHughes, [name] 05/13/2014, 7:40 PM

## 2014-05-13 NOTE — Evaluation (Signed)
Physical Therapy Evaluation Patient Details Name: Glenn ClarityMichael H [place] MRN: [id number] DOB: 05-04-1961 Today's Date: 05/13/2014   History of Present Illness  s/p RTKA  Past Medical History  Diagnosis Date  . No pertinent past medical history   . Arthritis    Past Surgical History  Procedure Laterality Date  . Wisdom tooth extraction    . Tonsillectomy    . Knee arthroscopy w/ acl reconstruction  2010    right  . Knee arthroscopy      right x3  . Abdominal surgery      to remove a polop  . Colonoscopy    . Appendectomy      Pt reports polyp removal as well as part of his colon along with appendix.     Clinical Impression  Pt is s/p TKA resulting in the deficits listed below (see PT Problem List).  Pt will benefit from skilled PT to increase their independence and safety with mobility to allow discharge to the venue listed below.      Follow Up Recommendations Home health PT;Supervision/Assistance - 24 hour    Equipment Recommendations  None recommended by PT    Recommendations for Other Services OT consult     Precautions / Restrictions Precautions Precautions: Fall;Knee Precaution Comments: Pt educated to not allow any pillow or bolster under knee for healing with optimal range of motion.  Restrictions Weight Bearing Restrictions: Yes RLE Weight Bearing: Weight bearing as tolerated      Mobility  Bed Mobility Overal bed mobility: Needs Assistance Bed Mobility: Supine to Sit     Supine to sit: Min guard     General bed mobility comments: Cues for technique  Transfers Overall transfer level: Needs assistance Equipment used: Rolling walker (2 wheeled) Transfers: Sit to/from Stand Sit to Stand: Min assist         General transfer comment: Cues for technique, safety, and hand placement  Ambulation/Gait             General Gait Details: Held amb today due to pt with increased lightheadedness  Stairs            Wheelchair Mobility     Modified Rankin (Stroke Patients Only)       Balance Overall balance assessment: Needs assistance         Standing balance support: Bilateral upper extremity supported Standing balance-Leahy Scale: Poor Standing balance comment: Noted decr R knee extension and stability in stance with heel raised off of floor; cues to self-monitor for activity tolerance; ultimately chose to lay back down as pt's lightheadedness increased and he had incr pallor and sweating; Resolved once laying back down                             Pertinent Vitals/Pain Pain Assessment: 0-10 Pain Score: 6  Pain Location: R knee Pain Descriptors / Indicators: Aching (with ankle propped) Pain Intervention(s): Monitored during session;Patient requesting pain meds-RN notified    Home Living Family/patient expects to be discharged to:: Private residence Living Arrangements: Spouse/significant other;Children Available Help at Discharge: Family;Available 24 hours/day Type of Home: House Home Access: Stairs to enter Entrance Stairs-Rails: None Entrance Stairs-Number of Steps: 2 (1+1) Home Layout: Two level;Able to live on main level with bedroom/bathroom Home Equipment: Dan HumphreysWalker - 2 wheels;Bedside commode      Prior Function Level of Independence: Independent               Hand Dominance  Extremity/Trunk Assessment   Upper Extremity Assessment: Overall WFL for tasks assessed           Lower Extremity Assessment: RLE deficits/detail RLE Deficits / Details: Grossly weak postop; able to activate quad and perform SLR in bed; decr knee stability in stance    Cervical / Trunk Assessment: Normal  Communication   Communication: No difficulties  Cognition Arousal/Alertness: Awake/alert Behavior During Therapy: WFL for tasks assessed/performed Overall Cognitive Status: Within Functional Limits for tasks assessed                      General Comments      Exercises  Total Joint Exercises Quad Sets: AROM;Right;10 reps Heel Slides: AAROM;AROM;Right;5 reps Straight Leg Raises: AROM;Right (3 reps)      Assessment/Plan    PT Assessment Patient needs continued PT services  PT Diagnosis Difficulty walking   PT Problem List Decreased strength;Decreased range of motion;Decreased activity tolerance;Decreased balance;Decreased mobility;Decreased knowledge of use of DME;Pain  PT Treatment Interventions DME instruction;Gait training;Stair training;Functional mobility training;Therapeutic activities;Therapeutic exercise;Balance training;Patient/family education   PT Goals (Current goals can be found in the Care Plan section) Acute Rehab PT Goals Patient Stated Goal: back to golf PT Goal Formulation: With patient Time For Goal Achievement: 05/20/14 Potential to Achieve Goals: Good    Frequency 7X/week   Barriers to discharge        Co-evaluation               End of Session Equipment Utilized During Treatment: Gait belt Activity Tolerance: Patient tolerated treatment well;Other (comment) (though limited by lightheadedness) Patient left: in bed;with call bell/phone within reach;with family/visitor present Nurse Communication: Mobility status;Patient requests pain meds         Time: [id number] PT Time Calculation (min) (ACUTE ONLY): 30 min   Charges:   PT Evaluation $Initial PT Evaluation Tier I: 1 Procedure PT Treatments $Therapeutic Exercise: 8-22 mins $Therapeutic Activity: 8-22 mins   PT G Codes:          Olen Pel,  [place] 05/13/2014, 4:47 PM  [place] , [place]  Acute Rehabilitation Services Pager [phone] Office [phone]

## 2014-05-13 NOTE — Care Management Note (Signed)
CARE MANAGEMENT NOTE 05/13/2014  Patient:  Glenn Daniels,Glenn H   Account Number:  [id number]  Date Initiated:  05/13/2014  Documentation initiated by:  Vance PeperBRADY,Bayleigh Loflin  Subjective/Objective Assessment:   [age] yr old male admitted with right knee DJD. Patient had a right total knee arthroplasty.     Action/Plan:   PT/OT eval.  [facility] changes.  CM will continue to monitor.   Anticipated DC Date:  05/14/2014   Anticipated DC Plan:  HOME W HOME HEALTH SERVICES      DC Planning Services  CM consult      PAC Choice  DURABLE MEDICAL EQUIPMENT  HOME HEALTH   Choice offered to / List presented to:  C-1 Patient   DME arranged  CPM  [facility] ROLL[facility]  3-N-1      DME agency  [employer]     HH arranged  HH-2 PT      [facility]  [facility]   Status of service:  In process, will continue to follow Medicare Important Message given?   (If response is "NO", the following Medicare IM given date fields will be blank) Date Medicare IM given:   Medicare IM given by:   Date Additional Medicare IM given:   Additional Medicare IM given by:    Discharge Disposition:    Per UR Regulation:  Reviewed for med. necessity/level of care/duration of stay

## 2014-05-13 NOTE — Anesthesia Procedure Notes (Addendum)
Anesthesia Regional Block:  Adductor canal block  Pre-Anesthetic Checklist: ,, timeout performed, Correct Patient, Correct Site, Correct Laterality, Correct Procedure, Correct Position, site marked, Risks and benefits discussed,  Surgical consent,  Pre-op evaluation,  At surgeon's request and post-op pain management  Laterality: Right  Prep: chloraprep       Needles:  Injection technique: Single-shot  Needle Type: Echogenic Stimulator Needle     Needle Length: 9cm 9 cm Needle Gauge: 21 and 21 G    Additional Needles:  Procedures: ultrasound guided (picture in chart) Adductor canal block Narrative:  Start time: 05/13/2014 7:10 AM End time: 05/13/2014 7:20 AM Injection made incrementally with aspirations every 5 mL.  Performed by: Personally   Additional Notes: Monitors applied. Patient sedated. Sterile prep and drape,hand hygiene and sterile gloves were used. Relevant anatomy identified.Needle position confirmed.Local anesthetic injected incrementally after negative aspiration. Local anesthetic spread visualized around nerve(s). Vascular puncture avoided. No complications. Image printed for medical record.The patient tolerated the procedure well.    Arta BruceKevin Louisa Favaro MD

## 2014-05-13 NOTE — Transfer of Care (Signed)
Immediate Anesthesia Transfer of Care Note  Patient: Glenn Daniels  Procedure(s) Performed: Procedure(s): RIGHT TOTAL KNEE ARTHROPLASTY (Right)  Patient Location: PACU  Anesthesia Type:General  Level of Consciousness: awake  Airway & Oxygen Therapy: Patient Spontanous Breathing and Patient connected to nasal cannula oxygen  Post-op Assessment: Report given to PACU RN and Post -op Vital signs reviewed and stable  Post vital signs: Reviewed and stable  Complications: No apparent anesthesia complications

## 2014-05-13 NOTE — Progress Notes (Signed)
Orthopedic Tech Progress Note Patient Details:  Glenn Daniels [birth year] [id number]  CPM Right Knee CPM Right Knee: On Right Knee Flexion (Degrees): 90 Right Knee Extension (Degrees): 0 Additional Comments: trapeze bar patient helper   Nikki DomCrawford, Jameila Keeny 05/13/2014, 11:06 AM

## 2014-05-13 NOTE — H&P (Signed)
  Glenn Daniels MRN:  [id number] DOB/SEX:  05-06-1961/male  CHIEF COMPLAINT:  Painful right Knee  HISTORY: Patient is a 53 y.o. male presented with a history of pain in the right knee. Onset of symptoms was gradual starting several years ago with gradually worsening course since that time. Prior procedures on the knee include none. Patient has been treated conservatively with over-the-counter NSAIDs and activity modification. Patient currently rates pain in the knee at 10 out of 10 with activity. There is pain at night.  PAST MEDICAL HISTORY: There are no active problems to display for this patient.  Past Medical History  Diagnosis Date  . No pertinent past medical history   . Arthritis    Past Surgical History  Procedure Laterality Date  . Wisdom tooth extraction    . Tonsillectomy    . Knee arthroscopy w/ acl reconstruction  2010    right  . Knee arthroscopy      right x3  . Abdominal surgery      to remove a polop  . Colonoscopy    . Appendectomy      Pt reports polyp removal as well as part of his colon along with appendix.     MEDICATIONS:   No prescriptions prior to admission    ALLERGIES:  No Known Allergies  REVIEW OF SYSTEMS:  A comprehensive review of systems was negative.   FAMILY HISTORY:  History reviewed. No pertinent family history.  SOCIAL HISTORY:   History  Substance Use Topics  . Smoking status: Never Smoker   . Smokeless tobacco: Never Used  . Alcohol Use: Yes     Comment: occ     EXAMINATION:  Vital signs in last 24 hours: Temp:  [97 F (36.1 C)] 97 F (36.1 C) (11/23 0620) Pulse Rate:  [54] 54 (11/23 0620) Resp:  [18] 18 (11/23 0620) BP: (121)/(70) 121/70 mmHg (11/23 0620) SpO2:  [100 %] 100 % (11/23 0620) Weight:  [93.35 kg (205 lb 12.8 oz)] 93.35 kg (205 lb 12.8 oz) (11/23 0620)  General appearance: alert, cooperative and no distress Lungs: clear to auscultation bilaterally Heart: regular rate and rhythm, S1, S2 normal, no  murmur, click, rub or gallop Abdomen: soft, non-tender; bowel sounds normal; no masses,  no organomegaly Extremities: extremities normal, atraumatic, no cyanosis or edema and Homans sign is negative, no sign of DVT Pulses: 2+ and symmetric Skin: Skin color, texture, turgor normal. No rashes or lesions Neurologic: Alert and oriented X 3, normal strength and tone. Normal symmetric reflexes. Normal coordination and gait  Musculoskeletal:  ROM 0-115, Ligaments intact,  Imaging Review Plain radiographs demonstrate severe degenerative joint disease of the right knee. The overall alignment is significant varus. The bone quality appears to be good for age and reported activity level.  Assessment/Plan: End stage arthritis, right knee   The patient history, physical examination and imaging studies are consistent with advanced degenerative joint disease of the right knee. The patient has failed conservative treatment.  The clearance notes were reviewed.  After discussion with the patient it was felt that Total Knee Replacement was indicated. The procedure,  risks, and benefits of total knee arthroplasty were presented and reviewed. The risks including but not limited to aseptic loosening, infection, blood clots, vascular injury, stiffness, patella tracking problems complications among others were discussed. The patient acknowledged the explanation, agreed to proceed with the plan.  , 05/13/2014, 6:42 AM

## 2014-05-13 NOTE — Progress Notes (Signed)
Orthopedic Tech Progress Note Patient Details:  Glenn Daniels 03-Apr-1961 [id number]  Patient ID: Glenn ClarityMichael H Daniels, male   DOB: 03-Apr-1961, 53 y.o.   MRN: [id number] Viewed order from doctor's order list  Nikki DomCrawford, Destinie Thornsberry 05/13/2014, 11:07 AM

## 2014-05-14 ENCOUNTER — Encounter (HOSPITAL_COMMUNITY): Payer: Self-pay | Admitting: Orthopedic Surgery

## 2014-05-14 LAB — BASIC METABOLIC PANEL
Anion gap: 11 (ref 5–15)
BUN: 9 mg/dL (ref 6–23)
CALCIUM: 8.1 mg/dL — AB (ref 8.4–10.5)
CO2: 25 mEq/L (ref 19–32)
Chloride: 102 mEq/L (ref 96–112)
Creatinine, Ser: 0.95 mg/dL (ref 0.50–1.35)
GFR calc Af Amer: >90 mL/min (ref >90)
GFR calc non Af Amer: >90 mL/min (ref >90)
GLUCOSE: 113 mg/dL — AB (ref 70–99)
Potassium: 3.7 mEq/L (ref 3.7–5.3)
Sodium: 138 mEq/L (ref 137–147)

## 2014-05-14 LAB — CBC
HCT: 33.2 % — ABNORMAL LOW (ref 39.0–52.0)
Hemoglobin: 11.6 g/dL — ABNORMAL LOW (ref 13.0–17.0)
MCH: 32.3 pg (ref 26.0–34.0)
MCHC: 34.9 g/dL (ref 30.0–36.0)
MCV: 92.5 fL (ref 78.0–100.0)
PLATELETS: 165 10*3/uL (ref 150–400)
RBC: 3.59 MIL/uL — ABNORMAL LOW (ref 4.22–5.81)
RDW: 12.7 % (ref 11.5–15.5)
WBC: 7 10*3/uL (ref 4.0–10.5)

## 2014-05-14 MED ORDER — OXYCODONE HCL ER 10 MG PO T12A: 10.0000 mg | EXTENDED_RELEASE_TABLET | Freq: Two times a day (BID) | ORAL | Status: DC

## 2014-05-14 MED ORDER — OXYCODONE HCL ER 10 MG PO T12A: 10.0000 mg | EXTENDED_RELEASE_TABLET | Freq: Two times a day (BID) | ORAL | Status: AC

## 2014-05-14 MED ORDER — ENOXAPARIN SODIUM 40 MG/0.4ML ~~LOC~~ SOLN: 40.0000 mg | SUBCUTANEOUS | Status: AC

## 2014-05-14 MED ORDER — METHOCARBAMOL 500 MG PO TABS: 500.0000 mg | ORAL_TABLET | Freq: Four times a day (QID) | ORAL | Status: DC | PRN

## 2014-05-14 MED ORDER — OXYCODONE HCL 5 MG PO TABS: 5.0000 mg | ORAL_TABLET | ORAL | Status: DC | PRN

## 2014-05-14 MED ORDER — OXYCODONE HCL 5 MG PO TABS: 5.0000 mg | ORAL_TABLET | ORAL | Status: AC | PRN

## 2014-05-14 MED ORDER — METHOCARBAMOL 500 MG PO TABS: 500.0000 mg | ORAL_TABLET | Freq: Four times a day (QID) | ORAL | Status: AC | PRN

## 2014-05-14 MED ORDER — ENOXAPARIN SODIUM 40 MG/0.4ML ~~LOC~~ SOLN: 40.0000 mg | SUBCUTANEOUS | Status: DC

## 2014-05-14 NOTE — Care Management Note (Signed)
CARE MANAGEMENT NOTE 05/14/2014  Patient:  Glenn Daniels,Glenn Daniels   Account Number:  [id number]  Date Initiated:  05/13/2014  Documentation initiated by:  Vance PeperBRADY,Jaryd Drew  Subjective/Objective Assessment:   53 yr old male admitted with right knee DJD. Patient had a right total knee arthroplasty.     Action/Plan:   PT/OT eval.  [facility] changes.  CM will continue to monitor.   Anticipated DC Date:  05/14/2014   Anticipated DC Plan:  HOME W HOME HEALTH SERVICES      DC Planning Services  CM consult      PAC Choice  DURABLE MEDICAL EQUIPMENT  HOME HEALTH   Choice offered to / List presented to:  C-1 Patient   DME arranged  CPM  [facility]  3-N-1      DME agency  [employer]     HH arranged  HH-2 PT      HH [facility]   Status of service:  Completed, signed off Medicare Important Message given?   (If response is "NO", the following Medicare IM given date fields will be blank) Date Medicare IM given:   Medicare IM given by:   Date Additional Medicare IM given:   Additional Medicare IM given by:    Discharge Disposition:  HOME W HOME HEALTH SERVICES  Per UR Regulation:  Reviewed for med. necessity/level of care/duration of stay

## 2014-05-14 NOTE — Progress Notes (Signed)
SPORTS MEDICINE AND JOINT REPLACEMENT  Glenn SpurlingStephen Lucey, MD   Glenn CabalMaurice , PA-C [address], [place], [place]  [id number]                             [phone]   PROGRESS NOTE  Subjective:  negative for Chest Pain  negative for Shortness of Breath  negative for Nausea/Vomiting   negative for Calf Pain  negative for Bowel Movement   Tolerating Diet: yes         Patient reports pain as 3 on 0-10 scale.    Objective: Vital signs in last 24 hours:   Patient Vitals for the past 24 hrs:  BP Temp Temp src Pulse Resp SpO2  05/14/14 1251 114/71 mmHg 98.5 F (36.9 C) - 69 16 93 %  05/14/14 0600 117/60 mmHg 98.9 F (37.2 C) - 72 18 92 %  05/14/14 0400 - - - - 18 96 %  05/14/14 0156 114/62 mmHg 99.4 F (37.4 C) - 72 18 96 %  05/14/14 0000 - - - - 18 100 %  05/13/14 2011 118/73 mmHg 98.8 F (37.1 C) - 73 16 100 %  05/13/14 2000 - - - - 18 100 %  05/13/14 1310 (!) 99/58 mmHg 98.1 F (36.7 C) Oral 68 16 -    @flow {1959:LAST@   Intake/Output from previous day:   11/23 0701 - 11/24 0700 In: 3540 [P.O.:1640; I.V.:1400] Out: 500 [Drains:450]   Intake/Output this shift:   11/24 0701 - 11/24 1900 In: -  Out: 200 [Urine:200]   Intake/Output      11/23 0701 - 11/24 0700 11/24 0701 - 11/25 0700   P.O. 1640    I.V. (mL/kg) 1400 (15)    Other 500    Total Intake(mL/kg) 3540 (37.9)    Urine (mL/kg/hr)  200 (0.3)   Drains 450 (0.2)    Blood 50 (0)    Total Output 500 200   Net +3040 -200           LABORATORY DATA:  Recent Labs  05/13/14 1350 05/14/14 0547  WBC 9.7 7.0  HGB 12.9* 11.6*  HCT 37.4* 33.2*  PLT 197 165    Recent Labs  05/13/14 1350 05/14/14 0547  NA  --  138  K  --  3.7  CL  --  102  CO2  --  25  BUN  --  9  CREATININE 0.83 0.95  GLUCOSE  --  113*  CALCIUM  --  8.1*   Lab Results  Component Value Date   INR 0.94 05/02/2014    Examination:  General appearance: alert, cooperative and no distress Extremities: extremities normal,  atraumatic, no cyanosis or edema and Homans sign is negative, no sign of DVT  Wound Exam: clean, dry, intact   Drainage:  Scant/small amount Serosanguinous exudate  Motor Exam: EHL and FHL Intact  Sensory Exam: Deep Peroneal normal   Assessment:    1 Day Post-Op  Procedure(s) (LRB): RIGHT TOTAL KNEE ARTHROPLASTY (Right)  ADDITIONAL DIAGNOSIS:  Active Problems:   S/P total knee replacement using cement  Acute Blood Loss Anemia   Plan: Physical Therapy as ordered Weight Bearing as Tolerated (WBAT)  DVT Prophylaxis:  Lovenox  DISCHARGE PLAN: Home  DISCHARGE NEEDS: HHPT, CPM, Walker and 3-in-1 comode seat         , 05/14/2014, 1:09 PM

## 2014-05-14 NOTE — Discharge Instructions (Signed)
Diet: As you were doing prior to hospitalization  ° °Activity:  Increase activity slowly as tolerated  °                No lifting or driving for 6 weeks ° °Shower:  May shower without a dressing once there is no drainage from your wound. °                Do NOT wash over the wound. °                °Dressing:  You may change your dressing on Wednesday °                   Then change the dressing daily with sterile 4"x4"s gauze dressing  °                   And TED hose for knees. ° °Weight Bearing:  Weight bearing as tolerated as taught in physical therapy.  Use a                                walker or Crutches as instructed. ° °To prevent constipation: you may use a stool softener such as - °              Colace ( over the counter) 100 mg by mouth twice a day  °              Drink plenty of fluids ( prune juice may be helpful) and high fiber foods °               Miralax ( over the counter) for constipation as needed.   ° °Precautions:  If you experience chest pain or shortness of breath - call 911 immediately               For transfer to the hospital emergency department!! °              If you develop a fever greater that 101 F, purulent drainage from wound,                             increased redness or drainage from wound, or calf pain -- Call the office. ° °Follow- Up Appointment:  Please call for an appointment to be seen on 05/28/14 °                                             [place] office:  [phone] °           [address] , [place] [address] °               ° ° °

## 2014-05-14 NOTE — Op Note (Signed)
TOTAL KNEE REPLACEMENT OPERATIVE NOTE:  05/13/2014  11:05 AM  PATIENT:  Glenn ClarityMichael H Lichty  [age] y.o. male  PRE-OPERATIVE DIAGNOSIS:  osteoarthritis right knee  POST-OPERATIVE DIAGNOSIS:  osteoarthritis right knee  PROCEDURE:  Procedure(s): RIGHT TOTAL KNEE ARTHROPLASTY  SURGEON:  Surgeon(s): Dannielle HuhSteve , MD  PHYSICIAN ASSISTANT: Altamese CabalMaurice Jones, [facility]  ANESTHESIA:   spinal  DRAINS: Hemovac  SPECIMEN: None  COUNTS:  Correct  TOURNIQUET:   Total Tourniquet Time Documented: Thigh (Right) - 68 minutes Total: Thigh (Right) - 68 minutes   DICTATION:  Indication for procedure:    The patient is a 53 y.o. male who has failed conservative treatment for osteoarthritis right knee.  Informed consent was obtained prior to anesthesia. The risks versus benefits of the operation were explain and in a way the patient can, and did, understand.   On the implant demand matching protocol, this patient scored 16.  Therefore, this patient was receive a polyethylene insert with vitamin E which is a high demand implant.  Description of procedure:     The patient was taken to the operating room and placed under anesthesia.  The patient was positioned in the usual fashion taking care that all body parts were adequately padded and/or protected.  I foley catheter was not placed.  A tourniquet was applied and the leg prepped and draped in the usual sterile fashion.  The extremity was exsanguinated with the esmarch and tourniquet inflated to 350 mmHg.  Pre-operative range of motion was 10-120  degrees flexion.  The knee was in 5 degree of mild varus.  A midline incision approximately 6-7 inches long was made with a #10 blade.  A new blade was used to make a parapatellar arthrotomy going 2-3 cm into the quadriceps tendon, over the patella, and alongside the medial aspect of the patellar tendon.  A synovectomy was then performed with the #10 blade and forceps. I then elevated the deep MCL off the medial tibial  metaphysis subperiosteally around to the semimembranosus attachment.    I everted the patella and used calipers to measure patellar thickness.  I used the reamer to ream down to appropriate thickness to recreate the native thickness.  I then removed excess bone with the rongeur and sagittal saw.  I used the appropriately sized template and drilled the three lug holes.  I then put the trial in place and measured the thickness with the calipers to ensure recreation of the native thickness.  The trial was then removed and the patella subluxed and the knee brought into flexion.  A homan retractor was place to retract and protect the patella and lateral structures.  A Z-retractor was place medially to protect the medial structures.  The extra-medullary alignment system was used to make cut the tibial articular surface perpendicular to the anamotic axis of the tibia and in 3 degrees of posterior slope.  The cut surface and alignment jig was removed.  I then used the intramedullary alignment guide to make a 6 valgus cut on the distal femur.  I then marked out the epicondylar axis on the distal femur.  The posterior condylar axis measured 3 degrees.  I then used the anterior referencing sizer and measured the femur to be a size 11.  The 4-In-1 cutting block was screwed into place in external rotation matching the posterior condylar angle, making our cuts perpendicular to the epicondylar axis.  Anterior, posterior and chamfer cuts were made with the sagittal saw.  The cutting block and cut pieces were  removed.  A lamina spreader was placed in 90 degrees of flexion.  The ACL, PCL, menisci, and posterior condylar osteophytes were removed.  A 11 mm spacer blocked was found to offer good flexion and extension gap balance after moderate in degree releasing.   The scoop retractor was then placed and the femoral finishing block was pinned in place.  The small sagittal saw was used as well as the lug drill to finish the  femur.  The block and cut surfaces were removed and the medullary canal hole filled with autograft bone from the cut pieces.  The tibia was delivered forward in deep flexion and external rotation.  A size G tray was selected and pinned into place centered on the medial 1/3 of the tibial tubercle.  The reamer and keel was used to prepare the tibia through the tray.    I then trialed with the size 11 femur, size G tibia, a 11 mm insert and the 38 patella.  I had excellent flexion/extension gap balance, excellent patella tracking.  Flexion was full and beyond 120 degrees; extension was zero.  These components were chosen and the staff opened them to me on the back table while the knee was lavaged copiously and the cement mixed.  The soft tissue was infiltrated with 60cc of exparel 1.3% through a 21 gauge needle.  I cemented in the components and removed all excess cement.  The polyethylene tibial component was snapped into place and the knee placed in extension while cement was hardening.  The capsule was infilltrated with 30cc of .25% Marcaine with epinephrine.  A hemovac was place in the joint exiting superolaterally.  A pain pump was place superomedially superficial to the arthrotomy.  Once the cement was hard, the tourniquet was let down.  Hemostasis was obtained.  The arthrotomy was closed with figure-8 #1 vicryl sutures.  The deep soft tissues were closed with #0 vicryls and the subcuticular layer closed with a running #2-0 vicryl.  The skin was reapproximated and closed with skin staples.  The wound was dressed with xeroform, 4 x4's, 2 ABD sponges, a single layer of webril and a TED stocking.   The patient was then awakened, extubated, and taken to the recovery room in stable condition.  BLOOD LOSS:  300cc DRAINS: 1 hemovac, 1 pain catheter COMPLICATIONS:  None.  PLAN OF CARE: Admit to inpatient   PATIENT DISPOSITION:  PACU - hemodynamically stable.   Delay start of Pharmacological VTE agent  (>24hrs) due to surgical blood loss or risk of bleeding:  not applicable  Please fax a copy of this op note to my office at [phone] (please only include page 1 and 2 of the Case Information op note)

## 2014-05-14 NOTE — Progress Notes (Signed)
Physical Therapy Treatment Patient Details Name: Glenn Daniels Wolfgang MRN: [id number] DOB: Oct 22, 1960 Today's Date: 05/14/2014    History of Present Illness Glenn Daniels. Tardif is a 53 y.o. male s/p R TKA. Pt with hx of Left knee scope.    PT Comments    Continuing progress with functional mobility, increasing amb distance and stair trianing complete; OK for dc home from PT standpoint   Follow Up Recommendations  Home health PT;Supervision/Assistance - 24 hour     Equipment Recommendations  None recommended by PT    Recommendations for Other Services       Precautions / Restrictions Precautions Precautions: Fall;Knee Precaution Comments: Pt educated to not allow any pillow or bolster under knee for healing with optimal range of motion.  Restrictions Weight Bearing Restrictions: Yes RLE Weight Bearing: Weight bearing as tolerated    Mobility  Bed Mobility Overal bed mobility: Modified Independent                Transfers Overall transfer level: Needs assistance Equipment used: Rolling walker (2 wheeled) Transfers: Sit to/from Stand Sit to Stand: Supervision         General transfer comment: Min guard for safety. Good hand placement  Ambulation/Gait Ambulation/Gait assistance: Supervision Ambulation Distance (Feet): 520 Feet Assistive device: Rolling walker (2 wheeled) Gait Pattern/deviations: Step-through pattern     General Gait Details: Cues to activate R quad for incr stance stability; cues for posture as well   Stairs Stairs: Yes Stairs assistance: Min assist Stair Management: No rails;Backwards;With walker;Step to pattern (and 2 sideways with rail) Number of Stairs: 4 (total) General stair comments: Verbal and demo cues for technique; managed quite well   Wheelchair Mobility    Modified Rankin (Stroke Patients Only)       Balance                                    Cognition Arousal/Alertness: Awake/alert Behavior During  Therapy: WFL for tasks assessed/performed Overall Cognitive Status: Within Functional Limits for tasks assessed                      Exercises      General Comments        Pertinent Vitals/Pain Pain Assessment: 0-10 Pain Score: 7  Pain Location: R knee Pain Descriptors / Indicators: Aching Pain Intervention(s): Monitored during session    Home Living                      Prior Function            PT Goals (current goals can now be found in the care plan section) Acute Rehab PT Goals Patient Stated Goal: back to golf PT Goal Formulation: With patient Time For Goal Achievement: 05/20/14 Potential to Achieve Goals: Good Progress towards PT goals: Progressing toward goals    Frequency  7X/week    PT Plan Current plan remains appropriate    Co-evaluation             End of Session Equipment Utilized During Treatment: Gait belt Activity Tolerance: Patient tolerated treatment well Patient left: in bed;in CPM;with call bell/phone within reach;with family/visitor present     Time: [id number] PT Time Calculation (min) (ACUTE ONLY): 29 min  Charges:  $Gait Training: 23-37 mins  G Codes:      Van Clines,  [place] 05/14/2014, 5:03 PM  Van Clines , [place]  Acute Rehabilitation Services Pager [phone] Office [phone]

## 2014-05-14 NOTE — Plan of Care (Signed)
Problem: Phase I Progression Outcomes Goal: CMS/Neurovascular status WDL Outcome: Completed/Met Date Met:  05/14/14 Goal: Pain controlled with appropriate interventions Outcome: Completed/Met Date Met:  05/14/14 Goal: Dangle or out of bed evening of surgery Outcome: Completed/Met Date Met:  05/14/14 Goal: Initial discharge plan identified Outcome: Completed/Met Date Met:  05/14/14 Goal: Hemodynamically stable Outcome: Completed/Met Date Met:  05/14/14  Problem: Phase III Progression Outcomes Goal: Pain controlled on oral analgesia Outcome: Completed/Met Date Met:  05/14/14 Goal: Ambulates Outcome: Completed/Met Date Met:  05/14/14 Goal: Incision clean - minimal/no drainage Outcome: Completed/Met Date Met:  05/14/14 Goal: Discharge plan remains appropriate-arrangements made Outcome: Completed/Met Date Met:  05/14/14 Goal: Anticoagulant follow-up in place Outcome: Not Applicable Date Met:  93/79/02  Problem: Discharge Progression Outcomes Goal: Barriers To Progression Addressed/Resolved Outcome: Completed/Met Date Met:  05/14/14 Goal: CMS/Neurovascular status at or above baseline Outcome: Completed/Met Date Met:  05/14/14 Goal: Anticoagulant follow-up in place Outcome: Not Applicable Date Met:  40/97/35 Goal: Pain controlled with appropriate interventions Outcome: Completed/Met Date Met:  05/14/14 Goal: Hemodynamically stable Outcome: Completed/Met Date Met:  32/99/24 Goal: Complications resolved/controlled Outcome: Completed/Met Date Met:  05/14/14 Goal: Tolerates diet Outcome: Completed/Met Date Met:  05/14/14 Goal: Activity appropriate for discharge plan Outcome: Completed/Met Date Met:  05/14/14 Goal: Ambulates safely using assistive device Outcome: Completed/Met Date Met:  05/14/14 Goal: Follows weight - bearing limitations Outcome: Completed/Met Date Met:  05/14/14 Goal: Discharge plan in place and appropriate Outcome: Completed/Met Date Met:  05/14/14 Goal:  Demonstrates ADLs as appropriate Outcome: Completed/Met Date Met:  05/14/14

## 2014-05-14 NOTE — Progress Notes (Signed)
Physical Therapy Treatment Patient Details Name: Glenn Daniels MRN: [id number] DOB: 1960-08-10 Today's Date: 05/14/2014    History of Present Illness Glenn Daniels is a 53 y.o. male s/p R TKA. Pt with hx of Left knee scope.    PT Comments    Much improved activity tolerance and functional mobility today; knee nice and stable in stance; on track for dc home later today; Reinforce stair training with pt and wife next session  Follow Up Recommendations  Home health PT;Supervision/Assistance - 24 hour     Equipment Recommendations  None recommended by PT    Recommendations for Other Services       Precautions / Restrictions Precautions Precautions: Fall;Knee Precaution Comments: Pt educated to not allow any pillow or bolster under knee for healing with optimal range of motion.  Restrictions Weight Bearing Restrictions: Yes RLE Weight Bearing: Weight bearing as tolerated    Mobility  Bed Mobility Overal bed mobility: Modified Independent Bed Mobility: Supine to Sit              Transfers Overall transfer level: Needs assistance Equipment used: Rolling walker (2 wheeled) Transfers: Sit to/from Stand Sit to Stand: Min guard (without physical contact)         General transfer comment: Min guard for safety. Good hand placement  Ambulation/Gait Ambulation/Gait assistance: Min guard;Supervision Ambulation Distance (Feet): 250 Feet Assistive device: Rolling walker (2 wheeled) Gait Pattern/deviations: Step-through pattern     General Gait Details: Cues to activate R quad for incr stance stability; cues for posture as well   Stairs Stairs: Yes Stairs assistance: Min assist Stair Management: No rails;Backwards;With walker;Step to pattern Number of Stairs: 2 General stair comments: Verbal and demo cues for technique; managed quite well and is interested in working on going up a flight of steps  Wheelchair Mobility    Modified Rankin (Stroke Patients  Only)       Balance                                    Cognition Arousal/Alertness: Awake/alert Behavior During Therapy: WFL for tasks assessed/performed Overall Cognitive Status: Within Functional Limits for tasks assessed                      Exercises Total Joint Exercises Ankle Circles/Pumps: AROM;Both;10 reps Quad Sets: AROM;Right;10 reps Short Arc Quad: AROM;Right;10 reps Heel Slides: AROM;Right;10 reps Straight Leg Raises: AROM;Right;10 reps Goniometric ROM: 1-100 deg approx    General Comments        Pertinent Vitals/Pain Pain Assessment: 0-10 Pain Score: 9  Pain Location: R knee at end range flexion Pain Descriptors / Indicators: Aching Pain Intervention(s): Monitored during session    Home Living Family/patient expects to be discharged to:: Private residence Living Arrangements: Spouse/significant other;Children Available Help at Discharge: Family;Available 24 hours/day Type of Home: House Home Access: Stairs to enter Entrance Stairs-Rails: None Home Layout: Two level;Able to live on main level with bedroom/bathroom Home Equipment: Dan HumphreysWalker - 2 wheels;Bedside commode Additional Comments: has received DME and CPM    Prior Function Level of Independence: Independent          PT Goals (current goals can now be found in the care plan section) Acute Rehab PT Goals Patient Stated Goal: back to golf PT Goal Formulation: With patient Time For Goal Achievement: 05/20/14 Potential to Achieve Goals: Good Progress towards PT goals: Progressing toward goals  Frequency  7X/week    PT Plan Current plan remains appropriate    Co-evaluation             End of Session Equipment Utilized During Treatment: Gait belt Activity Tolerance: Patient tolerated treatment well Patient left: in chair;with call bell/phone within reach;with family/visitor present     Time: 0826-0911 PT Time Calculation (min) (ACUTE ONLY): 45  min  Charges:  $Gait Training: 23-37 mins $Therapeutic Exercise: 8-22 mins                    G Codes:      Van Clines,  [place]ff 05/14/2014, 12:04 PM  Van Clines , PT  Acute Rehabilitation Services Pager [phone] Office [phone]

## 2014-05-14 NOTE — Progress Notes (Signed)
Occupational Therapy Evaluation Patient Details Name: Glenn Daniels MRN: [id number] DOB: 1961-04-21 Today's Date: 05/14/2014    History of Present Illness Glenn Daniels is a 53 y.o. male s/p R TKA. Pt with hx of Left knee scope.   Clinical Impression   PTA pt lived at home and was independent with ADLs. He enjoys golfing. Pt currently requires min (A) for LB ADLs and min guard for functional mobility. Pt plans to d/c home today and all education and training completed for safety with ADLs. No further acute OT needs.     Follow Up Recommendations  No OT follow up;Supervision - Intermittent    Equipment Recommendations  None recommended by OT    Recommendations for Other Services       Precautions / Restrictions Precautions Precautions: Fall;Knee Precaution Comments: Reviewed precautions and use of CPM/footsie roll with pt/wife.  Restrictions Weight Bearing Restrictions: Yes RLE Weight Bearing: Weight bearing as tolerated      Mobility Bed Mobility Overal bed mobility: Modified Independent                Transfers Overall transfer level: Needs assistance Equipment used: Rolling walker (2 wheeled) Transfers: Sit to/from Stand Sit to Stand: Min guard         General transfer comment: Min guard for safety. Good hand placement         ADL Overall ADL's : Needs assistance/impaired Eating/Feeding: Independent;Sitting   Grooming: Min guard;Standing   Upper Body Bathing: Set up;Sitting   Lower Body Bathing: Minimal assistance;Sit to/from stand   Upper Body Dressing : Set up;Sitting   Lower Body Dressing: Minimal assistance;Sit to/from stand   Toilet Transfer: Ambulation;RW;Min Psychiatristguard         Tub/Shower Transfer Details (indicate cue type and reason): educated pt/wife on safe shower transfer technique.  Functional mobility during ADLs: Min guard;Rolling walker       Vision  Pt reports no change from baseline.                     Perception Perception Perception Tested?: No   Praxis Praxis Praxis tested?: Within functional limits    Pertinent Vitals/Pain Pain Assessment: 0-10 Pain Score: 4  Pain Location: R knee Pain Descriptors / Indicators: Aching Pain Intervention(s): Monitored during session;Repositioned     Hand Dominance     Extremity/Trunk Assessment Upper Extremity Assessment Upper Extremity Assessment: Overall WFL for tasks assessed   Lower Extremity Assessment Lower Extremity Assessment: Defer to PT evaluation   Cervical / Trunk Assessment Cervical / Trunk Assessment: Normal   Communication Communication Communication: No difficulties   Cognition Arousal/Alertness: Awake/alert Behavior During Therapy: WFL for tasks assessed/performed Overall Cognitive Status: Within Functional Limits for tasks assessed                                Home Living Family/patient expects to be discharged to:: Private residence Living Arrangements: Spouse/significant other;Children Available Help at Discharge: Family;Available 24 hours/day Type of Home: House Home Access: Stairs to enter Entergy CorporationEntrance Stairs-Number of Steps: 2 Entrance Stairs-Rails: None Home Layout: Two level;Able to live on main level with bedroom/bathroom Alternate Level Stairs-Number of Steps: 12 Alternate Level Stairs-Rails: Left Bathroom Shower/Tub: Walk-in shower;Door   Foot LockerBathroom Toilet: Standard     Home Equipment: Environmental consultantWalker - 2 wheels;Bedside commode   Additional Comments: has received DME and CPM      Prior Functioning/Environment Level of Independence: Independent  OT Diagnosis: Generalized weakness;Acute pain    End of Session Equipment Utilized During Treatment: Rolling walker CPM Right Knee CPM Right Knee: Off Additional Comments: trapeze bar patient helper  Activity Tolerance: Patient tolerated treatment well Patient left: in bed;with call bell/phone within reach;with family/visitor  present   Time: 1610-96041023-1041 OT Time Calculation (min): 18 min Charges:  OT General Charges $OT Visit: 1 Procedure OT Evaluation $Initial OT Evaluation Tier I: 1 Procedure OT Treatments $Self Care/Home Management : 8-22 mins  Rae Lips,  M 05/14/2014, 10:48 AM   Carney Living Marie , OTR/L Occupational Therapist [phone] (pager)

## 2014-05-15 NOTE — Discharge Summary (Signed)
SPORTS MEDICINE & JOINT REPLACEMENT   Georgena SpurlingStephen Lucey, MD   Altamese CabalMaurice , PA-C [address], [place], [place]  [address]                             [phone]  PATIENT ID: Glenn Daniels        MRN:  [id number]          DOB/AGE: December 30, 1960 / 53 y.o.    DISCHARGE SUMMARY  ADMISSION DATE:    05/13/2014 DISCHARGE DATE:  05/14/2014  ADMISSION DIAGNOSIS: osteoarthritis right knee    DISCHARGE DIAGNOSIS:  osteoarthritis right knee    ADDITIONAL DIAGNOSIS: Active Problems:   S/P total knee replacement using cement  Past Medical History  Diagnosis Date  . No pertinent past medical history   . Arthritis     PROCEDURE: Procedure(s): RIGHT TOTAL KNEE ARTHROPLASTY on 05/13/2014  CONSULTS:     HISTORY:  See H&P in chart  HOSPITAL COURSE:  Glenn Daniels is a 53 y.o. admitted on 05/13/2014 and found to have a diagnosis of osteoarthritis right knee.  After appropriate laboratory studies were obtained  they were taken to the operating room on 05/13/2014 and underwent Procedure(s): RIGHT TOTAL KNEE ARTHROPLASTY.   They were given perioperative antibiotics:  Anti-infectives    Start     Dose/Rate Route Frequency Ordered Stop   05/13/14 1200  ceFAZolin (ANCEF) IVPB 2 g/50 mL premix     2 g100 mL/hr over 30 Minutes Intravenous Every 6 hours 05/13/14 1139 05/13/14 2230   05/12/14 1118  ceFAZolin (ANCEF) IVPB 2 g/50 mL premix  Status:  Discontinued     2 g100 mL/hr over 30 Minutes Intravenous On call to O.R. 05/12/14 1118 05/13/14 1139    .  Tolerated the procedure well.  Placed with a foley intraoperatively.  Given Ofirmev at induction and for 48 hours.    POD# 1: Vital signs were stable.  Patient denied Chest pain, shortness of breath, or calf pain.  Patient was started on Lovenox 30 mg subcutaneously twice daily at 8am.  Consults to PT, OT, and care management were made.  The patient was weight bearing as tolerated.  CPM was placed on the operative leg 0-90 degrees for 6-8  hours a day.  Incentive spirometry was taught.  Dressing was changed.  Hemovac was discontinued.      POD #2, Continued  PT for ambulation and exercise program.  IV saline locked.  O2 discontinued.    The remainder of the hospital course was dedicated to ambulation and strengthening.   The patient was discharged on 1 day post op in  Good condition.  Blood products given:none  DIAGNOSTIC STUDIES: Recent vital signs: No data found.      Recent laboratory studies:  Recent Labs  05/13/14 1350 05/14/14 0547  WBC 9.7 7.0  HGB 12.9* 11.6*  HCT 37.4* 33.2*  PLT 197 165    Recent Labs  05/13/14 1350 05/14/14 0547  NA  --  138  K  --  3.7  CL  --  102  CO2  --  25  BUN  --  9  CREATININE 0.83 0.95  GLUCOSE  --  113*  CALCIUM  --  8.1*   Lab Results  Component Value Date   INR 0.94 05/02/2014     Recent Radiographic Studies :  No results found.  DISCHARGE INSTRUCTIONS: Discharge Instructions    CPM  Complete by:  As directed   Continuous passive motion machine (CPM):      Use the CPM from 0 to 90 for 6-8 hours per day.      You may increase by 10 per day.  You may break it up into 2 or 3 sessions per day.      Use CPM for 2 weeks or until you are told to stop.     Call MD / Call 911    Complete by:  As directed   If you experience chest pain or shortness of breath, CALL 911 and be transported to the hospital emergency room.  If you develope a fever above 101 F, pus (white drainage) or increased drainage or redness at the wound, or calf pain, call your surgeon's office.     Change dressing    Complete by:  As directed   Change dressing on wednesday, then change the dressing daily with sterile 4 x 4 inch gauze dressing and apply TED hose.     Constipation Prevention    Complete by:  As directed   Drink plenty of fluids.  Prune juice may be helpful.  You may use a stool softener, such as Colace (over the counter) 100 mg twice a day.  Use MiraLax (over the counter)  for constipation as needed.     Diet - low sodium heart healthy    Complete by:  As directed      Do not put a pillow under the knee. Place it under the heel.    Complete by:  As directed      Driving restrictions    Complete by:  As directed   No driving for 6 weeks     Increase activity slowly as tolerated    Complete by:  As directed      Lifting restrictions    Complete by:  As directed   No lifting for 6 weeks     TED hose    Complete by:  As directed   Use stockings (TED hose) for 2 weeks on both leg(s).  You may remove them at night for sleeping.           DISCHARGE MEDICATIONS:     Medication List    TAKE these medications        enoxaparin 40 MG/0.4ML injection  Commonly known as:  LOVENOX  Inject 0.4 mLs (40 mg total) into the skin daily.     methocarbamol 500 MG tablet  Commonly known as:  ROBAXIN  Take 1-2 tablets (500-1,000 mg total) by mouth every 6 (six) hours as needed for muscle spasms.     oxyCODONE 5 MG immediate release tablet  Commonly known as:  Oxy IR/ROXICODONE  Take 1-2 tablets (5-10 mg total) by mouth every 3 (three) hours as needed for breakthrough pain.     OxyCODONE 10 mg T12a 12 hr tablet  Commonly known as:  OXYCONTIN  Take 1 tablet (10 mg total) by mouth every 12 (twelve) hours.        FOLLOW UP VISIT:       Follow-up Information    Follow up with Raymon MuttonLUCEY,STEPHEN D, MD. Call on 05/28/2014.   Specialty:  Orthopedic Surgery   Contact information:   [address] [place] [address] [phone]       [facility]   Why:  [facility] will contact you concerning start date and time for physical therapy.  Contact information:   [address] [place] [address] [phone]       DISPOSITION: HOME  CONDITION:  Good   , 05/15/2014, 5:34 PM

## 2014-10-12 NOTE — Op Note (Signed)
PATIENT NAME:  Glenn Daniels, [name] MR#:  [id number] DATE OF BIRTH:  [birth year]  DATE OF PROCEDURE:  01/01/2014  PREOPERATIVE DIAGNOSIS: Villous adenoma of the ascending colon.   POSTOPERATIVE DIAGNOSIS: Villous adenoma of the ascending colon.   PROCEDURE: Laparoscopic right colectomy.   SURGEON: Renda RollsWilton , M.D.   ANESTHESIA: General.   INDICATIONS: This [age] year old male recently had screening colonoscopy with findings of a 4 cm villous adenoma of the ascending colon. Surgery was recommended for definitive treatment.   DESCRIPTION OF PROCEDURE: The patient was placed on the operating table into the supine position under general endotracheal anesthesia. The abdomen was prepared with ChloraPrep and draped in a sterile manner. An infraumbilical incision was made large enough for a 10 mm port, carried down through subcutaneous tissues. The deep fascia was grasped with a Coker clamp and subsequently with bow and hook and elevated. A Veress needle was inserted into the peritoneal cavity, aspirated and irrigated with a saline solution. Next, the peritoneal cavity was insufflated with carbon dioxide. The Veress needle was removed. The 10 mm cannula was inserted. The 10 mm, 0 degrees laparoscope was inserted to view the peritoneal cavity. The liver appeared normal. Transverse colon was noted.  Location of the cecum determined. Another incision was made in the epigastrium to insert an 11 mm cannula.  A Babcock clamp was introduced to grasp the cecum and elevated for further inspection. The appendix was identified. Terminal ileum identified. Another short incision was made in the right lower quadrant to introduce a 5 mm cannula. Another 11 mm cannula was inserted in the epigastric midline. The right colon was mobilized with incision of the lateral peritoneal reflection using the Harmonic scalpel. Also, the appendix and terminal ileum were mobilized. Next, I noted there was minimal amount of omentum. This was  separated from the right transverse colon and mobilized the hepatic flexure as it was separated from the liver with the Harmonic scalpel. The right colon was further mobilized. The duodenum was identified, and after satisfactory mobilization of the whole right colon, the laparoscopic instruments were removed, seeing no bleeding from the cannula sites.  An upper abdominal midline incision was made from one port site to the other and carried down through subcutaneous tissues through the linea alba and made an opening, which was subsequently enlarged proximally and distally, large enough to bring the colon out on the abdominal wall. I could palpate some soft fullness within the ascending colon. A number of lymph nodes were identified which appeared to be normal size. The middle colic artery was palpated.  A site for a distal margin of resection was selected just to the right of the middle colic vessels. This dissection was begun by making an opening in the mesentery and then dissecting with the Harmonic scalpel. One branch of the middle colic artery was ligated with 0 chromic and divided with the Harmonic scalpel. Another window was made in the small bowel mesentery, some 4 inches proximal to the ileocecal valve, and began that dissection with the Harmonic scalpel. It is noted that the ileocolic artery was ligated with 0 chromic and then divided with the Harmonic scalpel. The ileocolic vein was likewise ligated and divided. The mesenteric dissection was completed with the Harmonic scalpel. The small bowel was brought adjacent to the transverse colon at the intended site of anastomosis holding these segments of bowel elevated with Allis clamps. An enterotomy was made with the cautery and a colotomy was made. The 75 mm GIA stapler was introduced  to begin the anastomosis along the antimesenteric side of the colon and small bowel. The stapler was engaged and activated and the staple line appeared to be hemostatic. Next,  the anastomosis was completed with application of the TA-60 stapler, which was placed perpendicular to the first staple line, engaged, and activated, and the specimen was excised with a scalpel and passed off to a side table. The stapling instrument was removed. The anastomosis was inspected. There was a bleeding point at the colonic end of the staple line and also a small hematoma formed.  This hematoma was amputated with another application of the TA-60 stapler and held this for 30 seconds prior to removing the stapler, and held pressure.  One other bleeding point was ligated with 3-0 chromic suture ligature. Hemostasis was subsequently intact as the anastomosis was inspected and found to be widely patent. The junction of the staple lines was imbricated with a 5-0 Vicryl figure-of-8 suture. Next, the mesenteric defect was closed with a running 3-0 chromic suture. Hemostasis was intact.   At this time, the gown, gloves, instruments, and towels were exchanged for clean ones, and also that the time, the bowel was opened and identified the villous adenoma in the ascending colon which appeared to be some 6 cm in dimension. There was no ulceration and the bowel was placed in formalin for routine pathology. After changing to new instruments, the wound was further inspected. Hemostasis appeared to be intact. Pull suction was placed in the right colic gutter and there was no significant amount of blood found.   Next, the midline fascia was closed with interrupted 0 Maxon figure-of-8 sutures. The skin was closed with interrupted 4-0 nylon vertical mattress sutures. Dressings were applied with paper tape. The patient tolerated surgery satisfactorily and was prepared for transfer to the recovery room.     ____________________________ Shela Commons. Renda Rolls, MD jws:ts D: 01/01/2014 13:37:18 ET T: 01/01/2014 16:12:40 ET JOB#: [id number]  cc: Adella Hare, MD, <Dictator> Adella Hare MD ELECTRONICALLY SIGNED 01/03/2014  9:24

## 2014-10-12 NOTE — Discharge Summary (Signed)
PATIENT NAME:  Edythe ClarityKING, [place] H MR#:  [id number] DATE OF BIRTH:  1960-12-20  DATE OF ADMISSION:  01/01/2014 DATE OF DISCHARGE:  01/04/2014  HISTORY OF PRESENT ILLNESS: This 54 year old male recently had screening colonoscopy with findings of a villous adenoma of the ascending colon area.   PAST MEDICAL HISTORY: Does include headaches and hyperlipidemia.   Details are recorded on the typed H and P.   HOSPITAL COURSE: The patient did have bowel preparation at home and came in through the outpatient surgery department. In the operating room, he had laparoscopic right colectomy. Postoperatively, he was treated with prophylactic subcutaneous heparin. It was further noted that he did have a preop prophylactic antibiotic. He did progress satisfactorily, beginning a liquid diet, and gradually advancing. Did demonstrate bowel function with passing gas and small amount of stool. His wound progressed satisfactorily.   Pathology reviewed.   FINAL DIAGNOSIS: Villous adenoma of the ascending colon.   OPERATION: Laparoscopic right colectomy.   Wound care instructions given and plans made for follow-up in the surgery department.   ____________________________ J. Renda RollsWilton Smith, MD jws:ts D: 01/11/2014 14:53:31 ET T: 01/11/2014 18:36:29 ET JOB#: [id number]  cc: Adella HareJ. Wilton Smith, MD, <Dictator> Adella HareWILTON J SMITH MD ELECTRONICALLY SIGNED 01/16/2014 17:43

## 2016-12-01 ENCOUNTER — Other Ambulatory Visit: Payer: Self-pay | Admitting: Unknown Physician Specialty

## 2016-12-01 DIAGNOSIS — H7490 Unspecified disorder of middle ear and mastoid, unspecified ear: Secondary | ICD-10-CM

## 2016-12-08 ENCOUNTER — Ambulatory Visit
Admission: RE | Admit: 2016-12-08 | Discharge: 2016-12-08 | Disposition: A | Discharge status: Home / Self Care | Payer: 59 | Source: Ambulatory Visit | Attending: Unknown Physician Specialty | Admitting: Unknown Physician Specialty

## 2016-12-08 DIAGNOSIS — H7490 Unspecified disorder of middle ear and mastoid, unspecified ear: Secondary | ICD-10-CM | POA: Diagnosis present

## 2016-12-08 MED ORDER — IOPAMIDOL (ISOVUE-370) INJECTION 76%
75.0000 mL | Freq: Once | INTRAVENOUS | Status: AC | PRN
  Administered 2016-12-08: 75 mL via INTRAVENOUS

## 2018-10-21 IMAGING — CT CT TEMPORAL BONES W/ CM
2 of 5 series · 11 of 40 positions shown, 13 images · IV contrast (isovue)
Comparison: None

CLINICAL DATA: Right-sided hearing loss. Abnormal ectopic exam.
Disorder of the right middle ear.

EXAM:
CT TEMPORAL BONES WITH CONTRAST
TECHNIQUE: Axial and coronal plane CT imaging of the petrous temporal bones was
performed with thin-collimation image reconstruction after
intravenous contrast administration. Multiplanar CT image
reconstructions were also generated.
CONTRAST:  75 mL Isovue 370

[Series 4: coronal bone. · coronal · 0.11mm/px · 3 of 138 slices shown]
[im 35/138  bone]
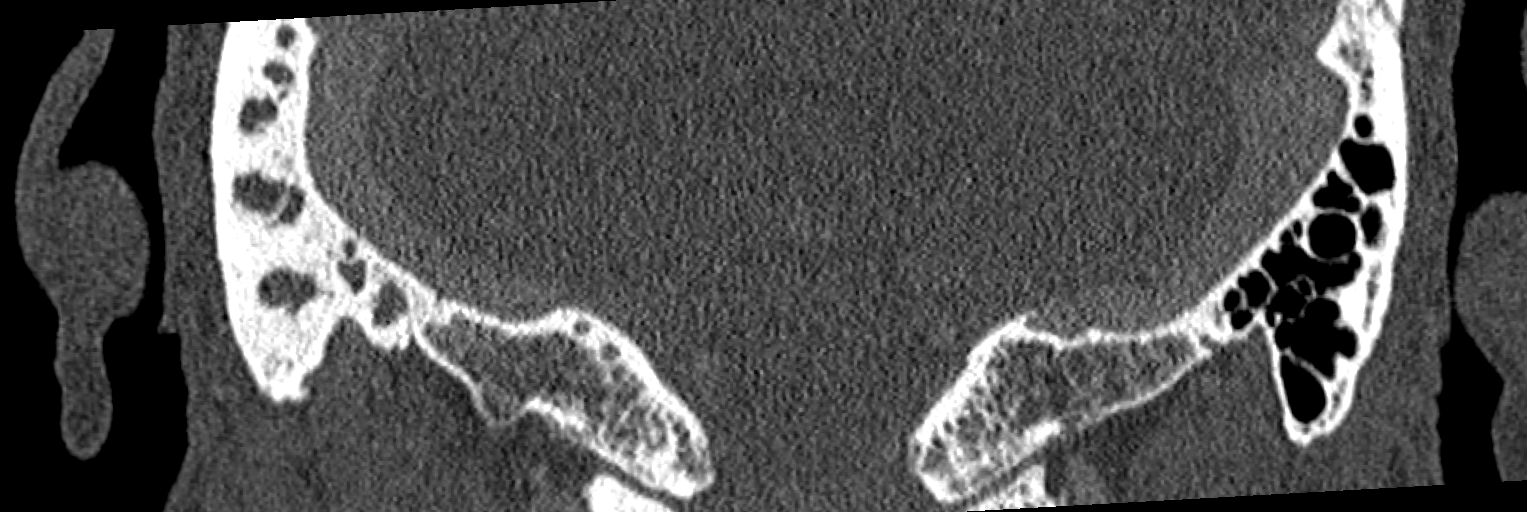
[im 69/138  bone]
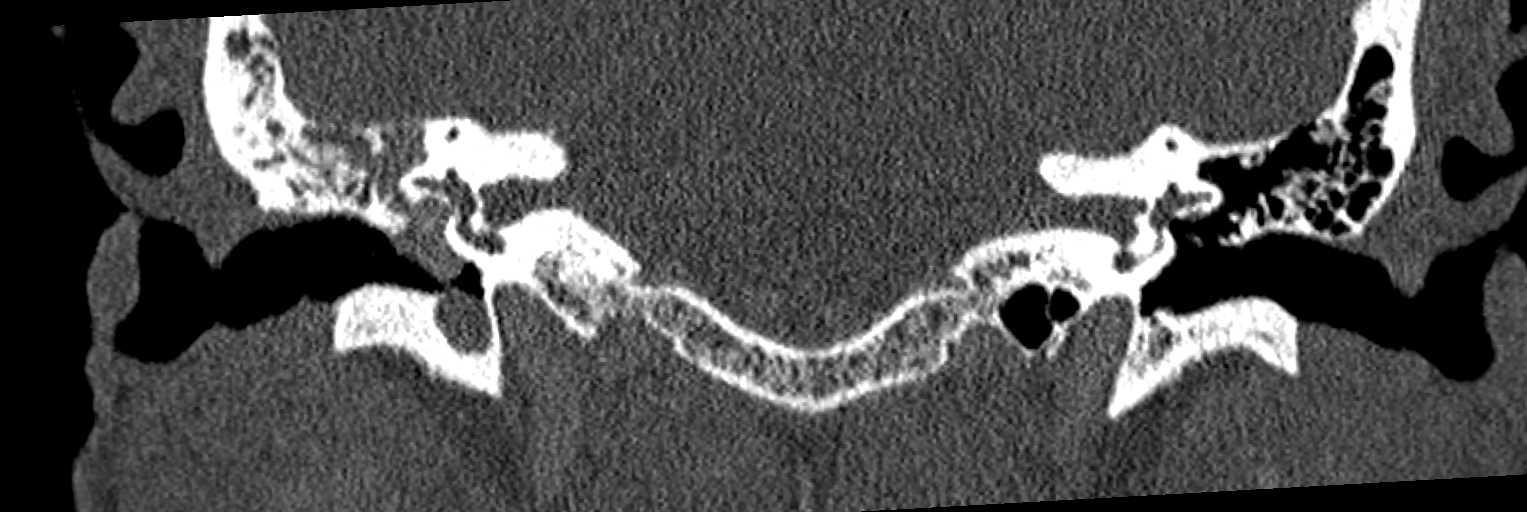
[im 103/138  bone]
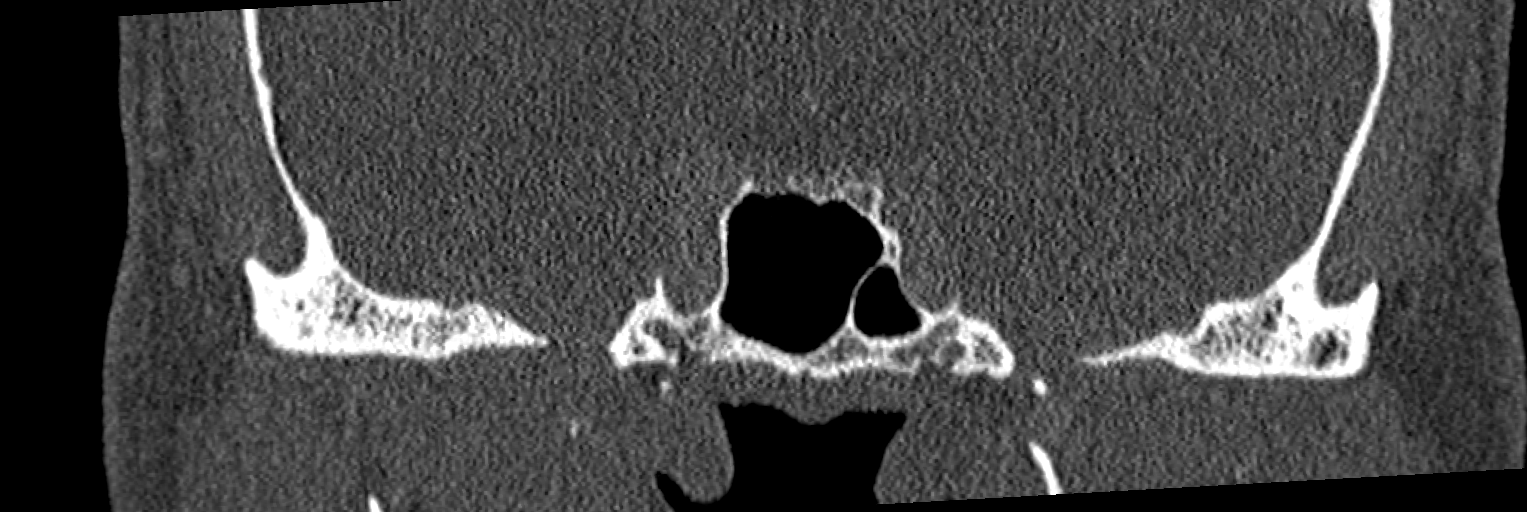

[Series 7: ax mag left · axial · 0.20mm/px · z∈[-80,-41]mm · 8 of 86 slices shown, 10 images]
[im 9/86  brain]
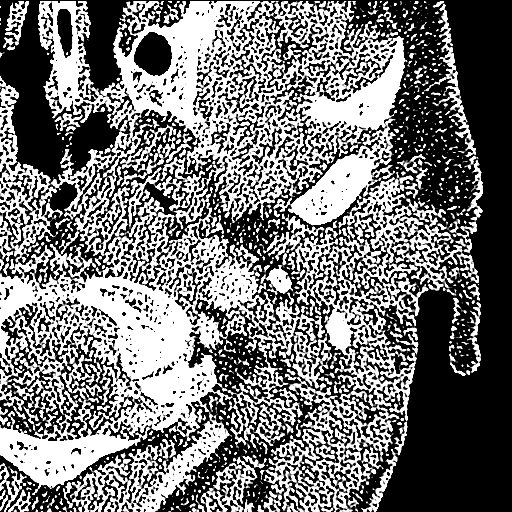
[im 9/86  bone]
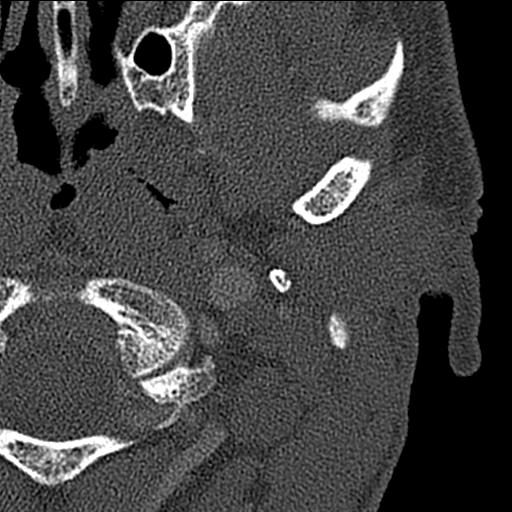
[im 17/86  bone]
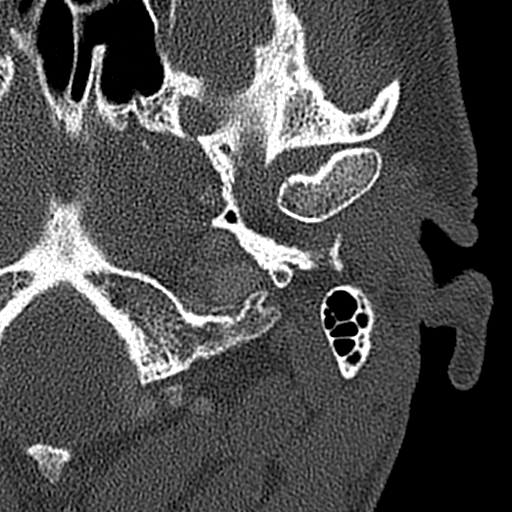
[im 29/86  bone]
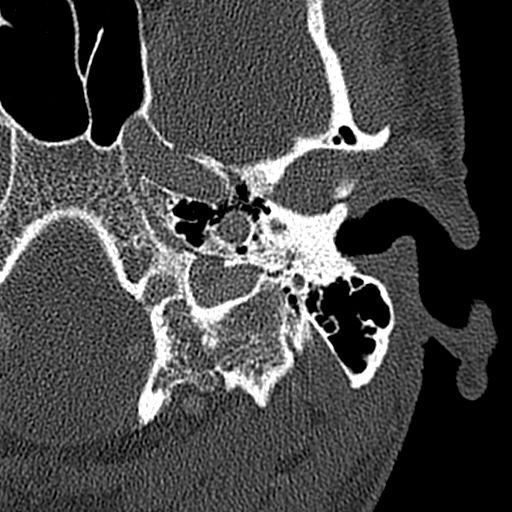
[im 37/86  bone]
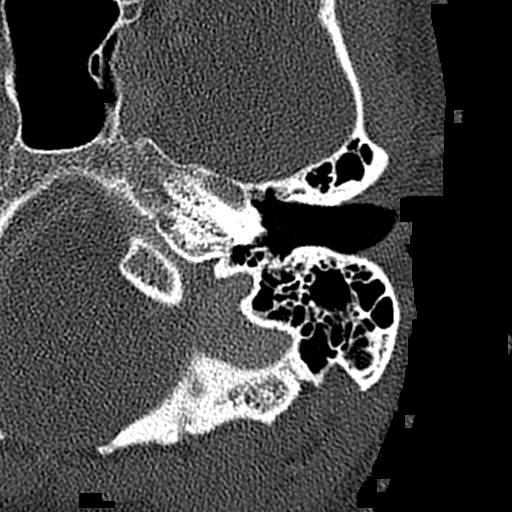
[im 49/86  brain]
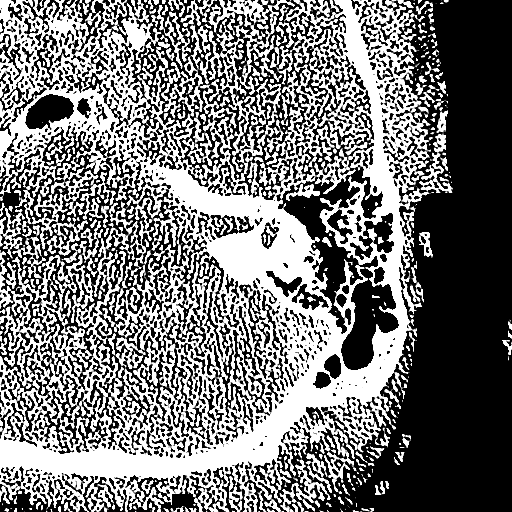
[im 49/86  bone]
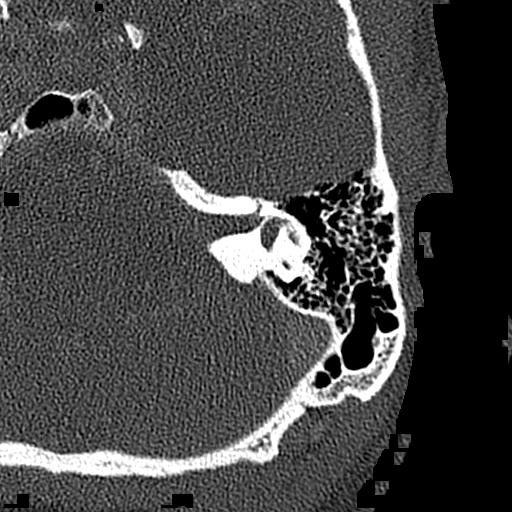
[im 57/86  bone]
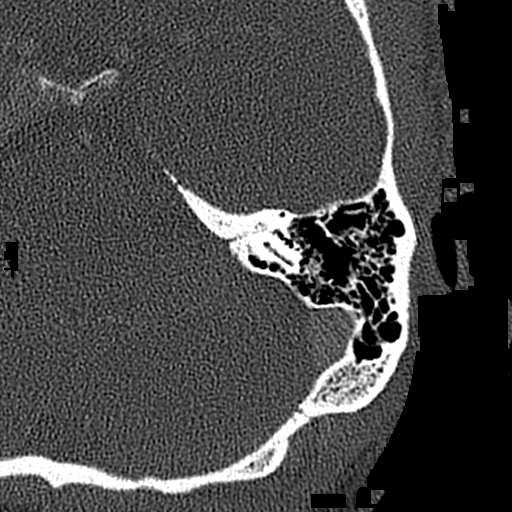
[im 69/86  bone]
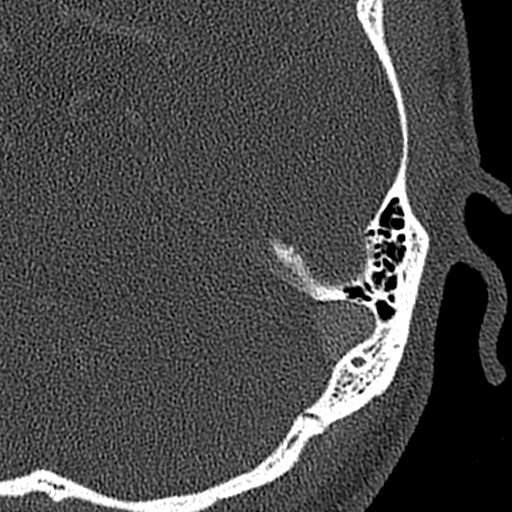
[im 77/86  bone]
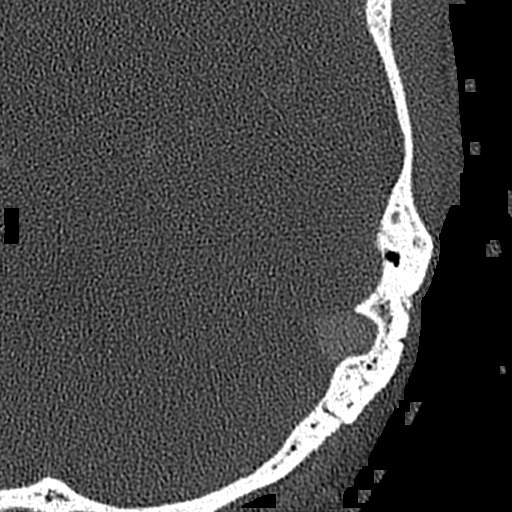

[11 of 40 positions shown; findings below may reference images not displayed]

FINDINGS: The visualized portions the brain are unremarkable. The globes and
orbits are intact. The visualized paranasal sinuses are clear.

The right external auditory canal is within normal limits. The
tympanic membrane is intact. There is soft tissue surrounding the
right middle ear ossicles and opacifying Prussak's space. There is
some blunting of the scutum. Incus and malleus appear to be intact
and normally articulating. The stapes is difficult to discretely
visualized. The soft tissue extends into the epitympanum. The
mastoid air cells are opacified. The tegmen tympani is thinned and
incompletely visualized. The soft tissue extends to the oval window.
The inner ear structures are normally formed. Cochlea and vestibule
are unremarkable. The lateral and superior semicircular canals are
covered. The facial nerve canal is intact. Internal auditory canal
and vestibular aqueduct are normal.

The left external auditory canal is clear. The tympanic membrane is
within normal limits. The left middle ear ossicles are normally
formed and articulating. The epitympanum is clear. A left mastoid
air cells are clear. The oval window is patent. The inner ear
structures are normally formed. The superior and lateral
semicircular canals are covered. The facial nerve canal is intact.
IMPRESSION: 1. Soft tissue within the right middle ear cavity surrounds the
middle ear ossicles, extends into Prussak's space and superiorly
into the aditus ad antrum. The findings are most compatible with an
acquired cholesteatoma.
2. Blunting of the right sided scutum without definite osseous
erosion otherwise.
3. Fluid or soft tissue fills the epitympanum. The right mastoid air
cells are opacified.
4. Normal CT appearance of the left temporal bone.

## 2019-03-13 ENCOUNTER — Other Ambulatory Visit: Payer: Self-pay | Admitting: *Deleted

## 2019-03-13 DIAGNOSIS — Z20822 Contact with and (suspected) exposure to covid-19: Secondary | ICD-10-CM

## 2019-03-15 LAB — NOVEL CORONAVIRUS, NAA: SARS-CoV-2, NAA: NOT DETECTED

## 2023-03-16 ENCOUNTER — Ambulatory Visit: Payer: BC Managed Care – PPO

## 2023-03-16 DIAGNOSIS — Z980 Intestinal bypass and anastomosis status: Secondary | ICD-10-CM | POA: Diagnosis not present

## 2023-03-16 DIAGNOSIS — K640 First degree hemorrhoids: Secondary | ICD-10-CM | POA: Diagnosis not present

## 2023-03-16 DIAGNOSIS — Z86010 Personal history of colonic polyps: Secondary | ICD-10-CM | POA: Diagnosis not present

## 2023-03-16 DIAGNOSIS — D123 Benign neoplasm of transverse colon: Secondary | ICD-10-CM | POA: Diagnosis not present

## 2023-03-16 DIAGNOSIS — Z1211 Encounter for screening for malignant neoplasm of colon: Secondary | ICD-10-CM | POA: Diagnosis present

## 2023-03-16 DIAGNOSIS — K635 Polyp of colon: Secondary | ICD-10-CM | POA: Diagnosis not present

## 2023-12-28 ENCOUNTER — Other Ambulatory Visit: Payer: Self-pay | Admitting: Family Medicine

## 2023-12-28 DIAGNOSIS — Z8249 Family history of ischemic heart disease and other diseases of the circulatory system: Secondary | ICD-10-CM

## 2024-01-05 ENCOUNTER — Ambulatory Visit
Admission: RE | Admit: 2024-01-05 | Discharge: 2024-01-05 | Disposition: A | Discharge status: Home / Self Care | Payer: Self-pay | Source: Ambulatory Visit | Attending: Family Medicine | Admitting: Family Medicine

## 2024-01-05 DIAGNOSIS — Z8249 Family history of ischemic heart disease and other diseases of the circulatory system: Secondary | ICD-10-CM | POA: Insufficient documentation
